# Patient Record
Sex: Male | Born: 1965 | Race: Black or African American | Hispanic: No | Smoking: Current some day smoker
Health system: Southern US, Community
[De-identification: ages and names within clinical notes are randomized; demographics above are authoritative.]

## PROBLEM LIST (undated history)

## (undated) DIAGNOSIS — J302 Other seasonal allergic rhinitis: Secondary | ICD-10-CM

## (undated) DIAGNOSIS — E785 Hyperlipidemia, unspecified: Secondary | ICD-10-CM

## (undated) DIAGNOSIS — E1165 Type 2 diabetes mellitus with hyperglycemia: Principal | ICD-10-CM

## (undated) HISTORY — DX: Hyperlipidemia, unspecified: E78.5

## (undated) HISTORY — DX: Type 2 diabetes mellitus with hyperglycemia: E11.65

## (undated) HISTORY — DX: Other seasonal allergic rhinitis: J30.2

## (undated) HISTORY — PX: OTHER SURGICAL HISTORY: SHX169

---

## 2005-07-05 HISTORY — PX: NASAL SINUS SURGERY: SHX719

## 2015-10-27 ENCOUNTER — Ambulatory Visit (INDEPENDENT_AMBULATORY_CARE_PROVIDER_SITE_OTHER): Payer: BLUE CROSS/BLUE SHIELD | Admitting: Endocrinology

## 2015-10-27 ENCOUNTER — Encounter: Payer: Self-pay | Admitting: Endocrinology

## 2015-10-27 VITALS — BP 130/86 | HR 100 | Temp 98.7°F | Resp 16 | Ht 68.0 in | Wt 212.4 lb

## 2015-10-27 DIAGNOSIS — E291 Testicular hypofunction: Secondary | ICD-10-CM

## 2015-10-27 DIAGNOSIS — R5383 Other fatigue: Secondary | ICD-10-CM

## 2015-10-27 DIAGNOSIS — N521 Erectile dysfunction due to diseases classified elsewhere: Secondary | ICD-10-CM | POA: Diagnosis not present

## 2015-10-27 DIAGNOSIS — R7989 Other specified abnormal findings of blood chemistry: Secondary | ICD-10-CM

## 2015-10-27 NOTE — Patient Instructions (Signed)
Will call when labs back  Consider seeing urologist

## 2015-10-27 NOTE — Progress Notes (Signed)
Patient ID: Adam Walker, male   DOB: 03/13/1965, 50 y.o.   MRN: [id number]          Chief complaint: Difficulty with erections  Referring physician: None  History of Present Illness  Hypogonadismwas diagnosed in 10/2015  He has had complaints offatigue for a year or 2 which he blames on weight gain and poor diet.   However his main concern is difficulty with erections for about 3 weeks only, as detailed in the review of systems He does not complain of decreased motivation, decreased libido    There is no history of the following: Hot flushes, night sweats, breast enlargement, long term anabolic steroid use, history of testicular injury or mumps in childhood. He did use some oral anabolic steroids 7-8 years ago for short time when he was weightlifting but none subsequently  No history of osteopenia or low impact fracture  Prior lab results showtestosterone levels of:   Fasting morning testosterone on 10/20/15 = 277 and free testosterone 7.0, normal 6.6-21.5  No results found for: TESTOSTERONE  Prolactin level: None available  No results found for: [facility]         Medication List    Notice  As of 10/27/2015  4:05 PM   You have not been prescribed any medications.      Allergies:  Allergies  Allergen Reactions  . Bactrim [Sulfamethoxazole-Trimethoprim] Itching    No past medical history on file.  No past surgical history on file.  Family History  Problem Relation Age of Onset  . Diabetes Mother   . Stroke Maternal Grandmother   . Stroke Paternal Grandmother     Social History:  reports that he has been smoking Cigars.  He has never used smokeless tobacco. His alcohol and drug histories are not on file.  Review of Systems  Constitutional: Positive for weight gain.       Gradually gained 30 lbs since 6 years ago, he thinks this is from poor diet and let of exercise but is trying to improve diet now  HENT: Positive for headaches.   Eyes: Negative for  blurred vision.  Respiratory: Negative for shortness of breath.   Cardiovascular: Negative for palpitations.  Endocrine: Positive for fatigue and erectile dysfunction.       Has had fatigue for about 1-2 years, recently slightly better with improving diet. He has intermittently abnormal erections for 3 weeks, sometimes very little but generally less full.  Has decreased but present nocturnal erections recently  Genitourinary: Negative for slow stream.  Musculoskeletal: Negative for joint pain.  Skin: Negative for rash.  Neurological: Negative for numbness.  Psychiatric/Behavioral: Negative for depressed mood.      General Examination:   BP 130/86 mmHg  Pulse 100  Temp(Src) 98.7 F (37.1 C)  Resp 16  Ht 5\' 8"  (1.727 m)  Wt 212 lb 6.4 oz (96.344 kg)  BMI 32.30 kg/m2  SpO2 96%  GENERAL APPEARANCE mild generalized obesity, mostly abdominal.  SKIN:normal, no rash or pigmentation.  HEENT:Oral mucosa normal. Relatively low and small oropharyngeal opening.  EYES:normal external appearance of eyes, Fundii benign.   NECK:no lymphadenopathy, no thyromegaly.  CHEST: Gynecomastia present, minimal on the left only LUNGS:clear to auscultation bilaterally, no wheezes, rhonchi, rales.   HEART:normal S1 And S2, no S3, S4, murmur or click.  ABDOMEN:Soft, no hepatosplenomegaly, no mass palpated    MALE GENITOURINARY:Testicular about 4 cm, no mass felt, did not feel cystic  MUSCULOSKELETALNo enlargement or deformity of joints.  EXTREMITIES:no clubbing, no edema.  NEUROLOGIC EXAM: Biceps reflexes normal (2+) bilaterally.   Assessment/ Plan:  1.  Probable mild hypogonadism  He has nonspecific symptoms of fatigue and not clear if he is truly symptomatic from hypogonadism since he has no change in libido or symptoms of decreased motivation Also his free testosterone is low normal Objectively does not have any  gynecomastia or testicular atrophy  His main symptom is erectile dysfunction of recent onset of unclear etiology; he does not have known diagnosed diabetes and only other potential factors are possibly hypertension and history of smoking  He needs further evaluation for his hypogonadism and also repeat free testosterone level in the morning Will order LH, prolactin, fasting glucose and free T4 levels also  Discussed various options for testosterone supplementation including transdermal gel or liquid preparations, patch and clomiphene.  Discussed pros and cons of various treatments  2.  Erectile dysfunction: Discussed that he may need to have evaluation from urologist, also he is complaining of some nonspecific testicular pain and mild swelling.  Does not appear to have any hydrocele on exam  3.  History of mild hypercholesterolemia.  Unknown glucose level  Brieana Shimmin 10/27/2015, 4:05 PM

## 2015-11-05 ENCOUNTER — Other Ambulatory Visit: Payer: Self-pay | Admitting: Endocrinology

## 2015-11-07 LAB — T4, FREE: Free T4: 1.22 ng/dL (ref 0.82–1.77)

## 2015-11-07 LAB — TESTOSTERONE, FREE, TOTAL, SHBG
SEX HORMONE BINDING: 23 nmol/L (ref 16.5–55.9)
Testosterone, Free: 4.6 pg/mL — ABNORMAL LOW (ref 6.8–21.5)
Testosterone: 229 ng/dL — ABNORMAL LOW (ref 348–1197)

## 2015-11-07 LAB — GLUCOSE, RANDOM: GLUCOSE: 166 mg/dL — AB (ref 65–99)

## 2015-11-07 LAB — PROLACTIN: PROLACTIN: 6.4 ng/mL (ref 4.0–15.2)

## 2015-11-07 LAB — LUTEINIZING HORMONE: LH: 6.7 m[IU]/mL (ref 1.7–8.6)

## 2015-11-12 ENCOUNTER — Ambulatory Visit: Payer: Self-pay | Admitting: Endocrinology

## 2015-11-18 NOTE — Progress Notes (Signed)
Quick Note:  Please let patient know that the testosterone is definitely low, likely to be from sluggish pituitary gland. Recommend clomiphene 50 mg, half tablet 3 times a week that should help improve natural production of testosterone, needs prescription Follow-up to be in 6 weeks from now, may need to change the scheduled appointment Also need to know if he was fasting as blood sugar was 166, high ______

## 2015-11-19 ENCOUNTER — Telehealth: Payer: Self-pay | Admitting: Endocrinology

## 2015-11-19 ENCOUNTER — Other Ambulatory Visit: Payer: Self-pay | Admitting: *Deleted

## 2015-11-19 ENCOUNTER — Encounter: Payer: Self-pay | Admitting: *Deleted

## 2015-11-19 MED ORDER — CLOMIPHENE CITRATE 50 MG PO TABS: ORAL_TABLET | ORAL | Status: DC

## 2015-11-19 NOTE — Telephone Encounter (Signed)
Relayed results pt was fasting when he had his blood work done  Pt is aware of the rx and how to take it

## 2015-11-19 NOTE — Telephone Encounter (Signed)
Noted  

## 2015-12-08 ENCOUNTER — Ambulatory Visit (INDEPENDENT_AMBULATORY_CARE_PROVIDER_SITE_OTHER): Payer: BLUE CROSS/BLUE SHIELD | Admitting: Endocrinology

## 2015-12-08 ENCOUNTER — Encounter: Payer: Self-pay | Admitting: Endocrinology

## 2015-12-08 VITALS — BP 132/74 | HR 83 | Temp 98.0°F | Resp 14 | Ht 68.0 in | Wt 207.6 lb

## 2015-12-08 DIAGNOSIS — E291 Testicular hypofunction: Secondary | ICD-10-CM | POA: Diagnosis not present

## 2015-12-08 DIAGNOSIS — E230 Hypopituitarism: Secondary | ICD-10-CM | POA: Diagnosis not present

## 2015-12-08 DIAGNOSIS — R7301 Impaired fasting glucose: Secondary | ICD-10-CM

## 2015-12-08 DIAGNOSIS — R7989 Other specified abnormal findings of blood chemistry: Secondary | ICD-10-CM

## 2015-12-08 LAB — TESTOSTERONE: TESTOSTERONE: 229.87 ng/dL — AB (ref 300.00–890.00)

## 2015-12-08 LAB — HEMOGLOBIN A1C: HEMOGLOBIN A1C: 8.7 % — AB (ref 4.6–6.5)

## 2015-12-08 NOTE — Progress Notes (Signed)
Patient ID: Adam Walker, male   DOB: Jul 17, 1965, 50 y.o.   MRN: [id number]          Chief complaint: Follow-up of low testosterone   History of Present Illness  Hypogonadismwas diagnosed in 10/2015 He has had complaints offatigue for a year or 2 which he blames on weight gain and poor diet.   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 Latest known visit with results is:  Orders Only on 11/05/2015  Component Date Value Ref Range Status  . Testosterone 11/05/2015 229* 348 - 1197 ng/dL Final  . Comment, Testosterone 11/05/2015 Comment   Final   Comment: Adult male reference interval is based on a population of lean males up to 49 years old.   . Testosterone, Free 11/05/2015 4.6* 6.8 - 21.5 pg/mL Final  . Sex Hormone Binding 11/05/2015 23.0  16.5 - 55.9 nmol/L Final  . Free T4 11/05/2015 1.22  0.82 - 1.77 ng/dL Final  . LH 11/05/2015 6.7  1.7 - 8.6 mIU/mL Final  . Prolactin 11/05/2015 6.4  4.0 - 15.2 ng/mL Final  . Glucose 11/05/2015 166* 65 - 99 mg/dL Final     OTHER active problems: See review of systems      Medication List       This list is accurate as of: 12/08/15  4:29 PM.  Always use  your most recent med list.               clomiPHENE 50 MG tablet  Commonly known as:  CLOMID  Take 1/2 tablet 3 times a week        Allergies:  Allergies  Allergen Reactions  . Bactrim [Sulfamethoxazole-Trimethoprim] Itching    Past Medical History  Diagnosis Date  . Hyperlipidemia     Cholesterol 239    No past surgical history on file.  Family History  Problem Relation Age of Onset  . Diabetes Mother   . Stroke Maternal Grandmother   . Stroke Paternal Grandmother     Social History:  reports that he has been smoking Cigars.  He has never used smokeless tobacco. His alcohol and drug histories are not on file.  Review of Systems   He has been trying to lose weight with low fat diet, eating more salads and avoiding restaurant meals Only recently starting to do some exercise  Wt Readings from Last 3 Encounters:  12/08/15 207 lb 9.6 oz (94.167 kg)  10/27/15 212 lb 6.4 oz (96.344 kg)   His fasting glucose in the lab was 166 in April He has improved his diet and lost weight.  Has not been told to have diabetes previously  No results found for: HGBA1C No results  found for: GLUF, MICROALBUR, LDLCALC, CREATININE  General Examination:   BP 132/74 mmHg  Pulse 83  Temp(Src) 98 F (36.7 C)  Resp 14  Ht 5\' 8"  (1.727 m)  Wt 207 lb 9.6 oz (94.167 kg)  BMI 31.57 kg/m2  SpO2 97%     Assessment/ Plan:  Hypogonadotropic hypogonadism  He has some improvement of his fatigue and erectile dysfunction with starting, phenol but this was only about 3 weeks ago Will check his testosterone level again to see if he has any improvement Also do better with his current change in lifestyle which is helping him lose weight  2.  Erectile dysfunction: Mildly improved, not clear if this is from possible diabetes and also may have some effect of the hypogonadism  3.  History of hypoglycemia: Will check A1c and glucose again today  Lamar Naef 12/08/2015, 4:29 PM

## 2015-12-09 ENCOUNTER — Other Ambulatory Visit (INDEPENDENT_AMBULATORY_CARE_PROVIDER_SITE_OTHER): Payer: BLUE CROSS/BLUE SHIELD

## 2015-12-09 DIAGNOSIS — E230 Hypopituitarism: Secondary | ICD-10-CM | POA: Diagnosis not present

## 2015-12-09 LAB — GLUCOSE, RANDOM: Glucose: 179 mg/dL — ABNORMAL HIGH (ref 65–99)

## 2015-12-09 LAB — LUTEINIZING HORMONE: LH: 11.16 m[IU]/mL — ABNORMAL HIGH (ref 1.50–9.30)

## 2015-12-09 NOTE — Progress Notes (Signed)
Quick Note:  Please let patient know that his blood sugar is in the diabetic range and still not improved. Recommend metformin ER 500 mg at dinner for the first days and then 2 tablets daily Will also recommend him setting up appointment with Vaughan Basta for diabetes education and home glucose monitoring His testosterone level is still slightly low but need to give it more time with the medication, next appointment should be done in the morning  ______

## 2015-12-23 ENCOUNTER — Telehealth: Payer: Self-pay | Admitting: Endocrinology

## 2015-12-23 MED ORDER — METFORMIN HCL ER 500 MG PO TB24: ORAL_TABLET | ORAL | Status: DC

## 2015-12-23 NOTE — Telephone Encounter (Signed)
Rx submitted per pt's request.  

## 2015-12-23 NOTE — Telephone Encounter (Signed)
Patient need a prescription for metformin send to  [id number] - [address], [place] [phone] (Phone) [phone] (Fax)

## 2016-01-19 ENCOUNTER — Ambulatory Visit: Payer: BLUE CROSS/BLUE SHIELD | Admitting: Endocrinology

## 2016-01-20 ENCOUNTER — Telehealth: Payer: Self-pay | Admitting: Endocrinology

## 2016-01-20 NOTE — Telephone Encounter (Signed)
meformin is causing constipation please advise

## 2016-01-20 NOTE — Telephone Encounter (Signed)
[phone] call this # ext [phone]

## 2016-01-21 NOTE — Telephone Encounter (Signed)
See note below and please advise, Thanks! 

## 2016-01-21 NOTE — Telephone Encounter (Signed)
Change in metformin ER to metformin regular, 500 mg twice a day

## 2016-01-22 MED ORDER — METFORMIN HCL 500 MG PO TABS: 500.0000 mg | ORAL_TABLET | Freq: Two times a day (BID) | ORAL | Status: DC

## 2016-01-22 NOTE — Telephone Encounter (Signed)
Patient notified and Rx sent in. 

## 2016-01-22 NOTE — Addendum Note (Signed)
Addended by: Royann Shivers A on: 01/22/2016 09:23 AM   Modules accepted: Orders, Medications

## 2016-02-04 ENCOUNTER — Encounter: Payer: Self-pay | Admitting: Endocrinology

## 2016-02-04 ENCOUNTER — Ambulatory Visit (INDEPENDENT_AMBULATORY_CARE_PROVIDER_SITE_OTHER): Payer: BLUE CROSS/BLUE SHIELD | Admitting: Endocrinology

## 2016-02-04 VITALS — BP 148/82 | HR 103 | Ht 68.0 in | Wt 205.0 lb

## 2016-02-04 DIAGNOSIS — E230 Hypopituitarism: Secondary | ICD-10-CM | POA: Diagnosis not present

## 2016-02-04 DIAGNOSIS — E1165 Type 2 diabetes mellitus with hyperglycemia: Secondary | ICD-10-CM | POA: Diagnosis not present

## 2016-02-04 LAB — BASIC METABOLIC PANEL
BUN: 11 mg/dL (ref 6–23)
CALCIUM: 9.4 mg/dL (ref 8.4–10.5)
CO2: 29 meq/L (ref 19–32)
CREATININE: 0.87 mg/dL (ref 0.40–1.50)
Chloride: 101 mEq/L (ref 96–112)
GFR: 120 mL/min (ref >60.00)
GLUCOSE: 129 mg/dL — AB (ref 70–99)
Potassium: 3.7 mEq/L (ref 3.5–5.1)
SODIUM: 138 meq/L (ref 135–145)

## 2016-02-04 MED ORDER — CLOMIPHENE CITRATE 50 MG PO TABS: ORAL_TABLET | ORAL | 1 refills | Status: DC

## 2016-02-04 MED ORDER — GLUCOSE BLOOD VI STRP: ORAL_STRIP | 2 refills | Status: DC

## 2016-02-04 NOTE — Patient Instructions (Addendum)
Check blood sugars on waking up  2 days a week  Also check blood sugars about 2 hours after a meal and do this after different meals by rotation  Recommended blood sugar levels on waking up is 90-130 and about 2 hours after meal is 130-160  Please bring your blood sugar monitor to each visit, thank you  Exercise

## 2016-02-04 NOTE — Progress Notes (Addendum)
Patient ID: Adam Walker, male   DOB: 02/15/1966, 50 y.o.   MRN: [id number]          Chief complaint: Follow-up of low testosterone   History of Present Illness  Hypogonadismwas diagnosed in 10/2015 He has had complaints offatigue for a year or 2 which he blames on weight gain and poor diet.   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  Latest known visit with results is:  Appointment on 12/09/2015  Component Date Value Ref Range Status  . LH 12/09/2015 11.16* 1.50 - 9.30 mIU/mL Final   Comment: Male Reference Range:20-70 yrs     1.5-9.3 mIU/mL>70 yrs       3.1-35.6 mIU/mLFemale Reference Range:Follicular Phase     1.9-12.5 mIU/mLMidcycle             8.7-76.3 mIU/mLLuteal Phase         0.5-16.9 mIU/mL  Post Menopausal      15.9-54.0  mIU/mLPregnant             <1.5 mIU/mLContraceptives       0.7-5.6  mIU/mL      OTHER active problems: See review of systems      Medication List       Accurate as of 02/04/16  1:30 PM. Always use your most recent med list.          clomiPHENE 50 MG tablet Commonly known as:  CLOMID Take 1/2 tablet 3 times a week   metFORMIN 500 MG tablet Commonly known as:  GLUCOPHAGE Take 1 tablet (500 mg total) by mouth 2 (two) times daily with a meal.       Allergies:  Allergies  Allergen Reactions  . Bactrim [Sulfamethoxazole-Trimethoprim] Itching    Past Medical History:  Diagnosis Date  . Hyperlipidemia    Cholesterol 239    No past surgical history on file.  Family History  Problem Relation Age of Onset  . Diabetes Mother   . Stroke Maternal Grandmother   . Stroke Paternal Grandmother     Social History:  reports that he has been smoking Cigars.  He has never used smokeless tobacco. His alcohol and drug histories are not on file.  Review of Systems   He has been trying to lose weight with low fat diet, eating more salads and avoiding restaurant meals Only some exercise  Wt  Readings from Last 3 Encounters:  02/04/16 205 lb (93 kg)  12/08/15 207 lb 9.6 oz (94.2 kg)  10/27/15 212 lb 6.4 oz (96.3 kg)    His fasting glucose in the lab was 178 A1c was checked and was 8.7 He was started on metformin and he is taking 1000 mg daily He was supposed to see better nurse educator to start monitoring and basic education but has not made an appointment  He says that occasionally he has a blood sugar checked at his family members house and he gets a reading of about 170 about 1-2 hours after eating He has not modified his diet and he further and does not have a meal plan. Also has been irregular with exercise because of his work hours  Lab Results  Component Value Date   HGBA1C 8.7 (H) 12/08/2015   No results found for: GLUF, MICROALBUR, [place], CREATININE  General Examination:   BP (!) 148/82 (BP Location: Left Arm, Patient  Position: Sitting, Cuff Size: Normal)   Pulse (!) 103   Ht 5\' 8"  (1.727 m)   Wt 205 lb (93 kg)   SpO2 97%   BMI 31.17 kg/m      Assessment/ Plan:  Hypogonadotropic hypogonadism  He has  improvement of his fatigue and erectile dysfunction with starting Clomiphene but he did not refill the prescription as directed Discussed that he needs to take this long-term and will need to refill this Will check his testosterone level again in about 6 weeks to see if he has any improvement  2.  Erectile dysfunction: Mildly improved, not clear if this is from diabetes, he reports less difficulties with using clomiphene anyway  3.  Diabetes with A1c 8.7 on metformin 1 g.  He needs to get diabetes education and start glucose monitoring and this was done today Follow-up in about 6-7 weeks Emphasized lifestyle changes  Nelson Noone 02/04/2016, 1:30 PM

## 2016-02-05 LAB — FRUCTOSAMINE: FRUCTOSAMINE: 252 umol/L (ref 0–285)

## 2016-02-26 ENCOUNTER — Telehealth: Payer: Self-pay | Admitting: Endocrinology

## 2016-02-26 MED ORDER — GLUCOSE BLOOD VI STRP: ORAL_STRIP | 2 refills | Status: AC

## 2016-02-26 MED ORDER — METFORMIN HCL 500 MG PO TABS: 500.0000 mg | ORAL_TABLET | Freq: Two times a day (BID) | ORAL | 3 refills | Status: DC

## 2016-02-26 NOTE — Telephone Encounter (Signed)
verio test strips [facility] [place]

## 2016-02-26 NOTE — Telephone Encounter (Signed)
PT called and said that the pharmacy told him that his test strips would not be covered under insurance.  He wants to know what he needs to do to get his test strips.  Please advise. He said you may call him at work if he does not answer his cell. # [phone] ext [phone]

## 2016-02-26 NOTE — Telephone Encounter (Signed)
Rx for Metformin and one touch test strips [facility] [place]

## 2016-02-27 NOTE — Telephone Encounter (Signed)
Attempted to reach the pt. Pt was unavailable. Will try again at a later time.  

## 2016-03-05 NOTE — Telephone Encounter (Signed)
Requested a call back from the patient to discuss.  

## 2016-04-01 ENCOUNTER — Telehealth: Payer: Self-pay | Admitting: Endocrinology

## 2016-04-01 ENCOUNTER — Ambulatory Visit: Payer: BLUE CROSS/BLUE SHIELD | Admitting: Endocrinology

## 2016-04-01 DIAGNOSIS — Z0289 Encounter for other administrative examinations: Secondary | ICD-10-CM

## 2016-04-01 NOTE — Telephone Encounter (Signed)
right

## 2016-04-01 NOTE — Telephone Encounter (Signed)
Patient no showed today's appt. Please advise on how to follow up. °A. No follow up necessary. °B. Follow up urgent. Contact patient immediately. °C. Follow up necessary. Contact patient and schedule visit in ___ days. °D. Follow up advised. Contact patient and schedule visit in ____weeks. ° °

## 2016-04-01 NOTE — Telephone Encounter (Signed)
Per check out note from last office visit pt could have labs same day. Last labs on 02/04/16

## 2016-04-01 NOTE — Telephone Encounter (Signed)
Reschedule at the earliest possible time, make sure he has had his labs also recently

## 2016-04-02 NOTE — Telephone Encounter (Signed)
Called pt no answer and no vm Will call again next week

## 2016-04-08 ENCOUNTER — Telehealth: Payer: Self-pay | Admitting: Endocrinology

## 2016-04-08 ENCOUNTER — Other Ambulatory Visit: Payer: Self-pay | Admitting: *Deleted

## 2016-04-08 MED ORDER — CLOMIPHENE CITRATE 50 MG PO TABS: ORAL_TABLET | ORAL | 3 refills | Status: DC

## 2016-04-08 MED ORDER — METFORMIN HCL 500 MG PO TABS: 500.0000 mg | ORAL_TABLET | Freq: Two times a day (BID) | ORAL | 3 refills | Status: DC

## 2016-04-08 NOTE — Telephone Encounter (Signed)
Rxs sent

## 2016-04-08 NOTE — Telephone Encounter (Signed)
[facility] [place]

## 2016-04-09 ENCOUNTER — Ambulatory Visit (INDEPENDENT_AMBULATORY_CARE_PROVIDER_SITE_OTHER): Payer: BLUE CROSS/BLUE SHIELD | Admitting: Endocrinology

## 2016-04-09 ENCOUNTER — Encounter: Payer: Self-pay | Admitting: Endocrinology

## 2016-04-09 VITALS — BP 154/96 | HR 102 | Temp 98.3°F | Resp 16 | Ht 68.0 in | Wt 207.0 lb

## 2016-04-09 DIAGNOSIS — E230 Hypopituitarism: Secondary | ICD-10-CM | POA: Diagnosis not present

## 2016-04-09 DIAGNOSIS — E782 Mixed hyperlipidemia: Secondary | ICD-10-CM | POA: Diagnosis not present

## 2016-04-09 DIAGNOSIS — E119 Type 2 diabetes mellitus without complications: Secondary | ICD-10-CM | POA: Diagnosis not present

## 2016-04-09 LAB — POCT GLYCOSYLATED HEMOGLOBIN (HGB A1C): Hemoglobin A1C: 6.4

## 2016-04-09 MED ORDER — SITAGLIPTIN PHOS-METFORMIN HCL 50-500 MG PO TABS: 1.0000 | ORAL_TABLET | Freq: Two times a day (BID) | ORAL | 2 refills | Status: DC

## 2016-04-09 NOTE — Patient Instructions (Signed)
Get labs in 1 month  Check blood sugars on waking up  2-3/7 days  Also check blood sugars about 2 hours after a meal and do this after different meals by rotation  Recommended blood sugar levels on waking up is 90-130 and about 2 hours after meal is 130-160  Please bring your blood sugar monitor to each visit, thank you

## 2016-04-09 NOTE — Progress Notes (Signed)
Patient ID: Adam Walker, male   DOB: 1966/07/03, 50 y.o.   MRN: [id number]          Chief complaint: Follow-up of low testosterone   History of Present Illness  Hypogonadismwas diagnosed in 10/2015  He has had complaints offatigue for a year or 2 which he blames on weight gain and poor diet.   However his main complaint on initial exam was  difficulty with erections for about 3 weeks . 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 he was started on clomiphene 25 mg 3 times a week, this was prescribed on 11/19/15  He thinks with this his energy level is better and has less complaints of sexual dysfunction However he did not refill his clomiphene prior to his last visit in also now has been out of it for 1-2 weeks again  He was supposed to get his labs drawn in the morning locally but apparently could not have this done because the lab did not have an order for this   Lab Results  Component Value Date   TESTOSTERONE 229.87 (L) 12/08/2015   TESTOSTERONE 229 (L) 11/05/2015       Office Visit on 04/09/2016  Component Date Value Ref Range Status  . Hemoglobin A1C 04/09/2016 6.4   Final     OTHER active problemsIncluding diabetes: See review of systems      Medication List       Accurate as of 04/09/16 11:59 PM. Always use your most recent med list.          clomiPHENE 50 MG tablet Commonly known as:  CLOMID Take 1/2 tablet 3 times a week   glucose blood test strip Commonly known as:  ONETOUCH VERIO Use to check blood sugar 1 time per day.   metFORMIN 500 MG tablet Commonly known as:   GLUCOPHAGE Take 1 tablet (500 mg total) by mouth 2 (two) times daily with a meal.   sitaGLIPtin-metformin 50-500 MG tablet Commonly known as:  JANUMET Take 1 tablet by mouth 2 (two) times daily with a meal.       Allergies:  Allergies  Allergen Reactions  . Bactrim [Sulfamethoxazole-Trimethoprim] Itching    Past Medical History:  Diagnosis Date  . Hyperlipidemia    Cholesterol 239    No past surgical history on file.  Family History  Problem Relation Age of Onset  . Diabetes Mother   . Stroke Maternal Grandmother   . Stroke Paternal Grandmother     Social History:  reports that he has been smoking Cigars.  He has never used smokeless tobacco. His alcohol and drug histories are not on file.  Review of Systems   He has been trying to lose weight with low fat diet, eating more salads But is not consistent recently Only doing some exercise  Wt Readings from Last 3 Encounters:  04/09/16 207 lb (93.9 kg)  02/04/16 205 lb (93 kg)  12/08/15 207 lb 9.6 oz (94.2 kg)    DIABETES:  His fasting glucose in the lab was 178 initially A1c was checked and was 8.7 He was started on metformin and he is taking  1000 mg daily He was supposed to see better nurse educator to start monitoring and basic education but has not made an appointment Fructosamine in August was 252  Because of his complaints of constipation with extended release metformin he is not taking regular metformin  Recently he thinks mostly compliant with this He was started on home glucose monitoring and he says his blood sugar ranges from 97-210, higher after meals based on his diet  Also has been irregular with exercise because of his work hours  He has not established with a local physician for general care  Lab Results  Component Value Date   HGBA1C 6.4 04/09/2016   HGBA1C 8.7 (H) 12/08/2015   Lab Results  Component Value Date   CREATININE 0.87 02/04/2016    ?  Hypertension: He blames his blood  pressure being high today on stress   General Examination:   BP (!) 154/96   Pulse (!) 102   Temp 98.3 F (36.8 C)   Resp 16   Ht 5\' 8"  (1.727 m)   Wt 207 lb (93.9 kg)   SpO2 98%   BMI 31.47 kg/m      Assessment/ Plan:  Hypogonadotropic hypogonadism  He has  improvement of his fatigue and erectile dysfunction with starting Clomiphene but he recently ran out of his medication again and has not taken it for 1-2 weeks He was supposed to have fasting testosterone level checked to monitor his treatment but has not done so  Since she is subjectively doing better will plan to continue clomiphene for now He will get his fasting testosterone level and luteinizing hormone done in about a month Will adjust medication accordingly  2.  History of hyperlipidemia: Needs fasting labs done through his PCP  3.  Diabetes with baseline A1c 8.7 With trying to improve his diet and some weight loss his blood sugars are better Currently only on 500 twice a day of metformin Even though he has a better A1c of 6.4 he still has some high readings at home  He may benefit long-term from adding Januvia and discussed how this will help his control He will switch metformin to Janumet 50/500 twice a day Encouraged him to start exercise regularly He will also find out what meter is covered by his insurance and call back Discussed timing and frequency of glucose monitoring and blood sugar targets  Patient Instructions  Get labs in 1 month  Check blood sugars on waking up  2-3/7 days  Also check blood sugars about 2 hours after a meal and do this after different meals by rotation  Recommended blood sugar levels on waking up is 90-130 and about 2 hours after meal is 130-160  Please bring your blood sugar monitor to each visit, thank you     Counseling time on subjects discussed above is over 50% of today's 25 minute visit   Adam Walker 04/11/2016, 8:31 PM

## 2016-05-06 LAB — TSH: TSH: 1.52 u[IU]/mL (ref <=5.90)

## 2016-05-06 LAB — HEMOGLOBIN A1C: HEMOGLOBIN A1C: 7.2

## 2016-06-03 ENCOUNTER — Encounter: Payer: Self-pay | Admitting: Endocrinology

## 2016-07-30 ENCOUNTER — Other Ambulatory Visit: Payer: Self-pay | Admitting: Endocrinology

## 2016-08-23 ENCOUNTER — Other Ambulatory Visit: Payer: Self-pay | Admitting: Endocrinology

## 2016-09-16 ENCOUNTER — Other Ambulatory Visit: Payer: Self-pay

## 2016-10-25 ENCOUNTER — Telehealth: Payer: Self-pay | Admitting: Endocrinology

## 2016-10-26 NOTE — Telephone Encounter (Signed)
Last ov 04/09/16 and no future scheduled ok to refill please advise

## 2016-10-26 NOTE — Telephone Encounter (Signed)
Patient needs appointment for any further refills.

## 2016-10-26 NOTE — Telephone Encounter (Signed)
Does not appear that he was scheduled for follow-up with labs, this needs to be done and he can have 30 day appointment

## 2016-11-03 ENCOUNTER — Encounter: Payer: Self-pay | Admitting: Endocrinology

## 2016-11-03 NOTE — Telephone Encounter (Signed)
Attempted to call the pt with no answer and no voicemail set up. Mailed a letter

## 2017-02-27 ENCOUNTER — Other Ambulatory Visit: Payer: Self-pay | Admitting: Endocrinology

## 2017-04-01 ENCOUNTER — Other Ambulatory Visit: Payer: Self-pay | Admitting: Endocrinology

## 2017-04-04 ENCOUNTER — Telehealth: Payer: Self-pay | Admitting: Endocrinology

## 2017-04-04 NOTE — Telephone Encounter (Signed)
routing

## 2017-04-04 NOTE — Telephone Encounter (Signed)
Patient last seen 04/09/2016. No follow up scheduled. Please advise if okay to refill?

## 2017-04-05 NOTE — Telephone Encounter (Signed)
Left message for patient to call to make an appt

## 2017-04-11 ENCOUNTER — Ambulatory Visit (INDEPENDENT_AMBULATORY_CARE_PROVIDER_SITE_OTHER): Payer: BLUE CROSS/BLUE SHIELD | Admitting: Endocrinology

## 2017-04-11 ENCOUNTER — Encounter: Payer: Self-pay | Admitting: Endocrinology

## 2017-04-11 VITALS — BP 154/108 | HR 103 | Ht 67.75 in | Wt 211.4 lb

## 2017-04-11 DIAGNOSIS — E1165 Type 2 diabetes mellitus with hyperglycemia: Secondary | ICD-10-CM | POA: Diagnosis not present

## 2017-04-11 DIAGNOSIS — E230 Hypopituitarism: Secondary | ICD-10-CM

## 2017-04-11 LAB — POCT GLYCOSYLATED HEMOGLOBIN (HGB A1C): HEMOGLOBIN A1C: 7.9

## 2017-04-11 MED ORDER — DULAGLUTIDE 0.75 MG/0.5ML ~~LOC~~ SOAJ: SUBCUTANEOUS | 1 refills | Status: DC

## 2017-04-11 NOTE — Patient Instructions (Signed)
Check blood sugars on waking up    Also check blood sugars about 2 hours after a meal and do this after different meals by rotation  Recommended blood sugar levels on waking up is 90-130 and about 2 hours after meal is 130-160  Please bring your blood sugar monitor to each visit, thank you  Start TRULICITYwith the pen as shown once weekly on the same day of the week.  You may inject in the stomach, thigh or arm as indicated in the brochure given. .  You will feel fullness of the stomach with starting the medication and should try to keep the portions at meals small.  You may experience nausea in the first few days which usually gets better over time   If any questions or concerns are present call the office or the  [facility] at [phone]. Also visit [email or web address] website for more useful information

## 2017-04-11 NOTE — Progress Notes (Signed)
Patient ID: Adam Walker, male   DOB: 08-08-1965, 51 y.o.   MRN: [id number]          Chief complaint: Follow-up of diabetes and low testosterone   History of Present Illness  Hypogonadismwas diagnosed in 10/2015  He has had complaints offatigue for a year or 2 which he blames on weight gain and poor diet.   However his main complaint on initial exam was  difficulty with erections for about 3 weeks . 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 he was started on clomiphene 25 mg 3 times a week, this was prescribed on 11/19/15 He had less fatigue and sexual dysfunction complaints with starting the clomiphene His follow-up testosterone done through his PCP was 387  However he did not follow-up since his last visit a year ago and has not taken clomiphene for several months Again his having some fatigue and decreased libido  Lab Results  Component Value Date   TESTOSTERONE 229.87 (L) 12/08/2015   TESTOSTERONE 229 (L) 11/05/2015       OTHER active problemsIncluding diabetes: See review of systems    Allergies as of 04/11/2017      Reactions   Bactrim [sulfamethoxazole-trimethoprim] Itching      Medication List       Accurate as of 04/11/17 11:59 PM. Always use your most recent med list.          clomiPHENE 50 MG tablet Commonly known as:  CLOMID TAKE 1/2 TABLET 3 TIMES A WEEK   Dulaglutide 0.75 MG/0.5ML Sopn Commonly known as:  TRULICITY Inject in the abdominal skin as directed once a week   glucose blood test strip Commonly known as:  ONETOUCH VERIO Use to check blood sugar 1 time per  day.       Past Medical History:  Diagnosis Date  . Hyperlipidemia    Cholesterol 239   Family History  Problem Relation Age of Onset  . Diabetes Mother   . Stroke Maternal Grandmother   . Stroke Paternal Grandmother      Social History:  reports that he has been smoking Cigars.  He has never used smokeless tobacco. His alcohol and drug histories are not on file.  Review of Systems    DIABETES:  A1c At baseline was 8.7 with fasting glucose 178 He was started on metformin but because of constipation with the metformin ER he was switched to Janumet prior to his last visit However his A1c appears to be higher at 7.9 now compared to 7.2   Recently he has been taking his Janumet although just running out of it recently He was started on home glucose monitoring  Did not bring his monitor but his blood sugar range is from 120-220 He thinks his blood sugars are higher when he goes off his diet He says he cannot control portions or what he choses when he is eating out although his doing somewhat better with avoiding donuts and sweets  Also has been irregular with exercise because of his work hours and lack of motivation  Wt Readings from Last 3 Encounters:  04/11/17 211 lb 6.4 oz (95.9 kg)  04/09/16 207 lb (93.9 kg)  02/04/16 205 lb (93 kg)   Lab Results  Component Value Date   HGBA1C 7.9 04/11/2017   HGBA1C 7.2 05/06/2016   HGBA1C 6.4 04/09/2016   Lab Results  Component Value Date   CREATININE 0.87 02/04/2016     General Examination:   BP (!) 154/108   Pulse (!) 103   Ht 5' 7.75" (1.721 m)   Wt 211 lb 6.4 oz (95.9 kg)   SpO2 97%   BMI 32.38 kg/m       Assessment/ Plan:  Hypogonadotropic hypogonadism  He has Not followed up as directed for a year Also has not been on his clomiphene for about a year He seems to be symptomatic again with fatigue and decreased libido Since previously he had the medical improvement and normalization of his  testosterone levels he needs to start taking clomiphene again He wants to take this as a whole tablet instead of half and he can take 50 mg twice a day He will need to get fasting labs done from [facility]. a week before he is seen in follow-up in 6 weeks Emphasized the need for regular follow-up   2.  History of hyperlipidemia: Needs follow-up with PCP  3.  Diabetes with baseline A1c 8.7 He has somewhat higher blood sugar compared to his last visit and increased A1c Although he tolerates Janumet very well he is not able to watch his diet or be motivated to exercise  He is interested in a medication that will help control his portions and carbohydrate cravings along with improving his weight and blood sugar control He is a good candidate for drug like Victoza or Trulicity but for simplicity since he is new to injection will do well with TRULICITY  Discussed with the patient the nature of GLP-1 drugs, the action on various organ systems, how they benefit blood glucose control, as well as the benefit of weight loss and  increase satiety . Explained possible side effects, particularly nausea and vomiting that usually resolve over time; discussed safety information in package insert. Demonstrated the medication injection device and injection technique to the patient.  Showed patient where to inject the medication. To start with 0.75 mg dosage weekly for the first 4 weeks Patient brochure on Trulicity with enclosed co-pay card given  He does need to start checking his blood sugars Encouraged him to start walking for exercise He will stop Janumet for now, may need metformin in combination if needed  Counseling time on subjects discussed in assessment and plan sections is over 50% of today's 25 minute visit   Patient instructions: Also check blood sugars about 2 hours after a meal and do this after different meals by rotation  Recommended blood sugar levels on waking up is 90-130 and about 2  hours after meal is 130-160  Please bring your blood sugar monitor to each visit, thank you  Start TRULICITYwith the pen as shown once weekly on the same day of the week.  You may inject in the stomach, thigh or arm as indicated in the brochure given. .  You will feel fullness of the stomach with starting the medication and should try to keep the portions at meals small.  You may experience nausea in the first few days which usually gets better over time   If any questions or concerns are present call the office or the  [facility] at [phone]. Also visit [email or web address] website for more useful information  Ashiya Kinkead 04/11/2017, 9:44 PM

## 2017-04-12 MED ORDER — CLOMIPHENE CITRATE 50 MG PO TABS: ORAL_TABLET | ORAL | 1 refills | Status: DC

## 2017-04-28 ENCOUNTER — Telehealth: Payer: Self-pay | Admitting: Endocrinology

## 2017-04-28 NOTE — Telephone Encounter (Signed)
Patient is having surgery tomorrow at [facility] in [place]. [facility] needs the  patient's latest lab work to faxed to [phone].  If questions please call Mariann Laster at ph# [phone]

## 2017-04-28 NOTE — Telephone Encounter (Signed)
I have faxed this paperwork to the number provided.

## 2017-06-03 ENCOUNTER — Other Ambulatory Visit: Payer: Self-pay | Admitting: Endocrinology

## 2017-08-02 ENCOUNTER — Ambulatory Visit: Payer: BLUE CROSS/BLUE SHIELD | Admitting: Endocrinology

## 2017-08-02 ENCOUNTER — Encounter: Payer: Self-pay | Admitting: Endocrinology

## 2017-08-02 VITALS — BP 144/95 | HR 92 | Temp 98.5°F | Ht 67.75 in | Wt 213.8 lb

## 2017-08-02 DIAGNOSIS — E230 Hypopituitarism: Secondary | ICD-10-CM

## 2017-08-02 DIAGNOSIS — I10 Essential (primary) hypertension: Secondary | ICD-10-CM

## 2017-08-02 DIAGNOSIS — E1165 Type 2 diabetes mellitus with hyperglycemia: Secondary | ICD-10-CM

## 2017-08-02 HISTORY — DX: Type 2 diabetes mellitus with hyperglycemia: E11.65

## 2017-08-02 LAB — POCT GLYCOSYLATED HEMOGLOBIN (HGB A1C): Hemoglobin A1C: 7.6

## 2017-08-02 MED ORDER — LOSARTAN POTASSIUM 50 MG PO TABS: 50.0000 mg | ORAL_TABLET | Freq: Every day | ORAL | 3 refills | Status: DC

## 2017-08-02 MED ORDER — DULAGLUTIDE 1.5 MG/0.5ML ~~LOC~~ SOAJ: SUBCUTANEOUS | 1 refills | Status: DC

## 2017-08-02 MED ORDER — CLOMIPHENE CITRATE 50 MG PO TABS: ORAL_TABLET | ORAL | 2 refills | Status: DC

## 2017-08-02 NOTE — Patient Instructions (Signed)
Check blood sugars on waking up  2/7   Also check blood sugars about 2 hours after a meal and do this after different meals by rotation  Recommended blood sugar levels on waking up is 90-130 and about 2 hours after meal is 130-160  Please bring your blood sugar monitor to each visit, thank you  Exercise

## 2017-08-02 NOTE — Progress Notes (Signed)
Patient ID: Adam Walker, male   DOB: 15-Jan-1966, 52 y.o.   MRN: [id number]          Chief complaint: Follow-up of diabetes and low testosterone   History of Present Illness  Hypogonadismwas diagnosed in 10/2015  He has had complaints offatigue for a year or 2 which he blames on weight gain and poor diet.   However his main complaint on initial exam was  difficulty with erections for about 3 weeks . 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 he was started on clomiphene 25 mg 3 times a week, this was prescribed on 11/19/15 He had less fatigue and sexual dysfunction complaints with starting the clomiphene His follow-up testosterone done through his PCP was 387  However he did not take clomiphene and 2018 He started back in 10/18 but since he did not come back for follow-up he has not taken this again for at least a month or 2  However he says he is feeling overall better with his energy level especially with changing his diet No recent labs available  Lab Results  Component Value Date   TESTOSTERONE 229.87 (L) 12/08/2015   TESTOSTERONE 229 (L) 11/05/2015       OTHER active problems Including diabetes addressed today: See review of systems    Allergies as of 08/02/2017      Reactions   Bactrim [sulfamethoxazole-trimethoprim] Itching      Medication List        Accurate as of 08/02/17  1:39 PM. Always use your most recent med list.          clomiPHENE 50 MG tablet Commonly known as:  CLOMID 1 tablet twice a week   glucose blood test strip Commonly known as:  ONETOUCH VERIO Use to  check blood sugar 1 time per day.   losartan 50 MG tablet Commonly known as:  COZAAR Take 1 tablet (50 mg total) by mouth daily.   TRULICITY 7.82 NF/6.2ZH Sopn Generic drug:  Dulaglutide INJECT IN THE ABDOMINAL SKIN AS DIRECTED ONCE A WEEK       Past Medical History:  Diagnosis Date  . Hyperlipidemia    Cholesterol 239  . Uncontrolled type 2 diabetes mellitus with hyperglycemia, without long-term current use of insulin ([facility] [place]) 08/02/2017   Family History  Problem Relation Age of Onset  . Diabetes Mother   . Stroke Maternal Grandmother   . Stroke Paternal Grandmother      Social History:  reports that he has been smoking Cigars.  He has never used smokeless tobacco. His alcohol and drug histories are not on file.  Review of Systems    DIABETES:  A1c At baseline was 8.7 with fasting glucose 178 He was started on metformin but because of constipation with the metformin ER he was switched to Janumet  His A1c is only slightly better at 7.6 compared to 7.9 in October  Current management, problems identified and blood sugars:   He has not checked his blood sugars much and did not bring his monitor  He thinks his blood sugars are about 90 fasting and possibly 120 otherwise but usually not checking after  meals  Although he has been taking his Trulicity regularly instead of Janumet he has not lost any weight  Most recently is trying to cut back on meats and portions but still has not wanted lost any weight  Also trying to cut back on sweets and sugar drinks Also has been irregular with exercise and recently not motivated although he thinks he can join a gym now    [facility] Readings from Last 3 Encounters:  08/02/17 213 lb 12.8 oz (97 kg)  04/11/17 211 lb 6.4 oz (95.9 kg)  04/09/16 207 lb (93.9 kg)   Lab Results  Component Value Date   HGBA1C 7.6 08/02/2017   HGBA1C 7.9 04/11/2017   HGBA1C 7.2 05/06/2016   Lab Results  Component Value Date   CREATININE 0.87 02/04/2016     HYPERTENSION: His blood pressure has been consistently high and he has not discussed this with any primary care physician, currently going to see her new PCP   BP Readings from Last 3 Encounters:  08/02/17 (!) 144/95  04/11/17 (!) 154/108  04/09/16 (!) 154/96     General Examination:   BP (!) 144/95 (Cuff Size: Large)   Pulse 92   Temp 98.5 F (36.9 C) (Oral)   Ht 5' 7.75" (1.721 m)   Wt 213 lb 12.8 oz (97 kg)   SpO2 98%   BMI 32.75 kg/m   No ankle edema    Assessment/ Plan:  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  HYPERTENSION: Blood pressure has been consistently high and discussed that he does have hypertension and needs treatment pharmacologically and will not get better with just exercise at this level, he is doing somewhat better with his diet overall  He will start LOSARTAN 50 mg daily for this  3.  Diabetes with baseline A1c 8.7  He has not had much from it in his blood sugar control with using low-dose Trulicity Again irregular with follow-up and checking readings after meals as directed Not exercising Currently  A1c is 7.6 and discussed that this is not at target  Only recently has started improving his diet and he thinks his blood sugars are near normal  For now he will increase the dose of Trulicity for better efficacy and weight loss Discussed checking blood sugars after meals consistently and bring monitor for download, discussed blood sugar targets Consultation with dietitian will be ordered  4.  LIPIDS: Needs assessment of this with labs today  Counseling time on subjects discussed in assessment and plan sections is over 50% of today's 25 minute visit          Dorion Petillo 04/11/2017, 9:44 PM

## 2017-08-03 LAB — COMPREHENSIVE METABOLIC PANEL
A/G RATIO: 1.3 (ref 1.2–2.2)
ALK PHOS: 136 IU/L — AB (ref 39–117)
ALT: 24 IU/L (ref 0–44)
AST: 21 IU/L (ref 0–40)
Albumin: 4.3 g/dL (ref 3.5–5.5)
BUN/Creatinine Ratio: 19 (ref 9–20)
BUN: 15 mg/dL (ref 6–24)
CHLORIDE: 97 mmol/L (ref 96–106)
CO2: 24 mmol/L (ref 20–29)
Calcium: 9.8 mg/dL (ref 8.7–10.2)
Creatinine, Ser: 0.77 mg/dL (ref 0.76–1.27)
GFR calc Af Amer: 122 mL/min/{1.73_m2} (ref >59)
GFR, EST NON AFRICAN AMERICAN: 106 mL/min/{1.73_m2} (ref >59)
GLOBULIN, TOTAL: 3.2 g/dL (ref 1.5–4.5)
Glucose: 130 mg/dL — ABNORMAL HIGH (ref 65–99)
POTASSIUM: 4.5 mmol/L (ref 3.5–5.2)
Sodium: 137 mmol/L (ref 134–144)
Total Protein: 7.5 g/dL (ref 6.0–8.5)

## 2017-08-03 LAB — TESTOSTERONE, FREE, TOTAL, SHBG
Sex Hormone Binding: 31.7 nmol/L (ref 19.3–76.4)
TESTOSTERONE FREE: 7.7 pg/mL (ref 7.2–24.0)
Testosterone: 320 ng/dL (ref 264–916)

## 2017-08-03 LAB — LIPID PANEL
CHOL/HDL RATIO: 5.8 ratio — AB (ref 0.0–5.0)
CHOLESTEROL TOTAL: 216 mg/dL — AB (ref 100–199)
HDL: 37 mg/dL — AB (ref >39)
TRIGLYCERIDES: 477 mg/dL — AB (ref 0–149)

## 2017-08-03 LAB — LUTEINIZING HORMONE: LH: 7.4 m[IU]/mL (ref 1.7–8.6)

## 2017-08-04 ENCOUNTER — Telehealth: Payer: Self-pay

## 2017-08-04 NOTE — Telephone Encounter (Signed)
Called patient.  No answer.

## 2017-08-04 NOTE — Telephone Encounter (Signed)
-----   Message from Elayne Snare, MD sent at 08/04/2017  5:06 PM EST ----- Please let them know that the testosterone level is back to normal, however his triglycerides/fats are very high.  Start fenofibrate 145 mg daily, does not need clomiphene now

## 2017-08-05 NOTE — Telephone Encounter (Signed)
Called pt. No answer °

## 2017-08-18 ENCOUNTER — Telehealth: Payer: Self-pay

## 2017-08-18 NOTE — Telephone Encounter (Signed)
Called pt  No answer  Sent letter

## 2017-08-19 ENCOUNTER — Other Ambulatory Visit: Payer: Self-pay | Admitting: Endocrinology

## 2017-08-19 MED ORDER — FENOFIBRATE 145 MG PO TABS: 145.0000 mg | ORAL_TABLET | Freq: Every day | ORAL | 2 refills | Status: DC

## 2017-09-22 ENCOUNTER — Other Ambulatory Visit: Payer: Self-pay | Admitting: Endocrinology

## 2017-10-19 ENCOUNTER — Ambulatory Visit: Payer: BLUE CROSS/BLUE SHIELD | Admitting: Endocrinology

## 2017-11-17 ENCOUNTER — Encounter: Payer: Self-pay | Admitting: Endocrinology

## 2017-11-17 ENCOUNTER — Ambulatory Visit: Payer: BLUE CROSS/BLUE SHIELD | Admitting: Endocrinology

## 2017-11-17 VITALS — BP 134/82 | HR 84 | Ht 67.75 in | Wt 201.2 lb

## 2017-11-17 DIAGNOSIS — R7989 Other specified abnormal findings of blood chemistry: Secondary | ICD-10-CM

## 2017-11-17 DIAGNOSIS — E1165 Type 2 diabetes mellitus with hyperglycemia: Secondary | ICD-10-CM | POA: Diagnosis not present

## 2017-11-17 DIAGNOSIS — R030 Elevated blood-pressure reading, without diagnosis of hypertension: Secondary | ICD-10-CM | POA: Diagnosis not present

## 2017-11-17 DIAGNOSIS — E782 Mixed hyperlipidemia: Secondary | ICD-10-CM | POA: Diagnosis not present

## 2017-11-17 LAB — LIPID PANEL
Cholesterol: 152 mg/dL (ref 0–200)
HDL: 37.1 mg/dL — ABNORMAL LOW (ref >39.00)
LDL Cholesterol: 95 mg/dL (ref 0–99)
NonHDL: 114.44
Total CHOL/HDL Ratio: 4
Triglycerides: 99 mg/dL (ref 0.0–149.0)
VLDL: 19.8 mg/dL (ref 0.0–40.0)

## 2017-11-17 LAB — COMPREHENSIVE METABOLIC PANEL
ALBUMIN: 3.8 g/dL (ref 3.5–5.2)
ALT: 15 U/L (ref 0–53)
AST: 16 U/L (ref 0–37)
Alkaline Phosphatase: 95 U/L (ref 39–117)
BUN: 10 mg/dL (ref 6–23)
CO2: 29 mEq/L (ref 19–32)
CREATININE: 0.72 mg/dL (ref 0.40–1.50)
Calcium: 9 mg/dL (ref 8.4–10.5)
Chloride: 103 mEq/L (ref 96–112)
GFR: 148.2 mL/min (ref >60.00)
Glucose, Bld: 111 mg/dL — ABNORMAL HIGH (ref 70–99)
POTASSIUM: 4.1 meq/L (ref 3.5–5.1)
SODIUM: 138 meq/L (ref 135–145)
TOTAL PROTEIN: 7.2 g/dL (ref 6.0–8.3)
Total Bilirubin: 0.4 mg/dL (ref 0.2–1.2)

## 2017-11-17 LAB — GLUCOSE, POCT (MANUAL RESULT ENTRY): POC Glucose: 114 mg/dl — AB (ref 70–99)

## 2017-11-17 LAB — MICROALBUMIN / CREATININE URINE RATIO
Creatinine,U: 229.3 mg/dL
MICROALB UR: 0.9 mg/dL (ref 0.0–1.9)
Microalb Creat Ratio: 0.4 mg/g (ref 0.0–30.0)

## 2017-11-17 LAB — POCT GLYCOSYLATED HEMOGLOBIN (HGB A1C): HEMOGLOBIN A1C: 6.1

## 2017-11-17 LAB — TESTOSTERONE: TESTOSTERONE: 195.38 ng/dL — AB (ref 300.00–890.00)

## 2017-11-17 NOTE — Patient Instructions (Signed)
Check blood sugars on waking up  2/7  Also check blood sugars about 2 hours after a meal and do this after different meals by rotation  Recommended blood sugar levels on waking up is 90-130 and about 2 hours after meal is 130-160  Please bring your blood sugar monitor to each visit, thank you  

## 2017-11-17 NOTE — Progress Notes (Signed)
Patient ID: Adam Walker, male   DOB: [birth year], 52 y.o.   MRN: [id number]          Chief complaint: Follow-up    History of Present Illness   DIABETES:  A1c At baseline was 8.7 with fasting glucose 178 He was started on metformin but because of constipation with the metformin ER he was switched to Janumet Subsequently because of his difficulty with weight loss he was then switched to Trulicity  His A2Z is significantly better at 6.1 compared to 7.6   Current management, problems identified and blood sugars:   He has increased the dose of Trulicity up to 1.5 mg since his last visit in January  Also on his own he has been much more motivated to watch his diet with cutting out high fat foods, sweet drinks, sweets and a lot of carbohydrates  He has also started walking both outside and on treadmill very regularly  With this he has lost about 12 pounds  He is checking his blood sugars somewhat sporadically and they are mostly in the 90-110 range at home  He thinks he has occasional readings after meals also  No side effects with Trulicity 1.5 mg  Blood glucose recent blood sugar readings: Monitor at home: Generic  Recent readings 93-109, mostly checking in the afternoon   Wt Readings from Last 3 Encounters:  11/17/17 201 lb 3.2 oz (91.3 kg)  08/02/17 213 lb 12.8 oz (97 kg)  04/11/17 211 lb 6.4 oz (95.9 kg)   Lab Results  Component Value Date   HGBA1C 6.1 11/17/2017   HGBA1C 7.6 08/02/2017   HGBA1C 7.9 04/11/2017   Lab Results  Component Value Date   MICROALBUR 0.9 11/17/2017   [place] 95 11/17/2017   CREATININE 0.72 11/17/2017      Hypogonadismwas diagnosed in 10/2015  He has had complaints offatigue for a year or 2 which he blames on weight gain and poor diet.   However his main complaint on initial exam was  difficulty with erections for about 3 weeks . 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 he was started on clomiphene 25 mg 3 times a week, this was prescribed on 11/19/15 He had less fatigue and sexual dysfunction complaints with starting the clomiphene His follow-up testosterone done through his PCP was 387  Recent history:  For several months he has been not taking any medication and did not take clomiphene again as prescribed in 10/18 His testosterone level had improved in 1/19 with lifestyle changes Currently he has no difficulties with libido, fatigue or decreased motivation  Lab Results  Component Value Date   TESTOSTERONE 320 08/02/2017   TESTOSTERONE 229.87 (L) 12/08/2015   TESTOSTERONE 229 (L) 11/05/2015       OTHER active problems Including diabetes addressed today: See review of systems    Allergies as of 11/17/2017      Reactions   Bactrim [sulfamethoxazole-trimethoprim] Itching      Medication List        Accurate as of 11/17/17  9:58 AM. Always use your most recent med list.          fenofibrate 145 MG  tablet Commonly known as:  TRICOR Take 1 tablet (145 mg total) by mouth daily.   glucose blood test strip Commonly known as:  ONETOUCH VERIO Use to check blood sugar 1 time per day.   losartan 50 MG tablet Commonly known as:  COZAAR Take 1 tablet (50 mg total) by mouth daily.   TRULICITY 1.5 WL/7.9GX Sopn Generic drug:  Dulaglutide INJECT ONCE A WEEK       Past Medical History:  Diagnosis Date  . Hyperlipidemia    Cholesterol 239  . Uncontrolled type 2 diabetes mellitus with hyperglycemia, without long-term current use of insulin ([facility]) 08/02/2017   Family History  Problem Relation Age of Onset  . Diabetes Mother   . Stroke  Maternal Grandmother   . Stroke Paternal Grandmother      Social History:  reports that he has been smoking Cigars.  He has never used smokeless tobacco. His alcohol and drug histories are not on file.  Review of Systems       HYPERTENSION: His blood pressure had been consistently high  Since he was not being treated by his PCP is blood pressure was high at 144/95 on his last visit He was started on losartan 50 mg daily  However about a month ago he thought that this was causing erectile dysfunction and he stopped taking this  However appears that blood pressure is still not high without medication He has changed his diet significantly and eating healthier as well as losing weight and exercising    BP Readings from Last 3 Encounters:  11/17/17 134/82  08/02/17 (!) 144/95  04/11/17 (!) 154/108   LIPIDS:  His triglycerides are significantly high he is now on fenofibrate However previously was not watching his diet with a lot of high fat foods and carbohydrates which he has reduced significantly  Lab Results  Component Value Date   CHOL 152 11/17/2017   CHOL 216 (H) 08/02/2017   Lab Results  Component Value Date   HDL 37.10 (L) 11/17/2017   HDL 37 (L) 08/02/2017   Lab Results  Component Value Date   LDLCALC 95 11/17/2017   [place] Comment 08/02/2017   Lab Results  Component Value Date   TRIG 99.0 11/17/2017   TRIG 477 (H) 08/02/2017   Lab Results  Component Value Date   CHOLHDL 4 11/17/2017   CHOLHDL 5.8 (H) 08/02/2017   No results found for: LDLDIRECT   General Examination:   BP 134/82 (BP Location: Left Arm, Patient Position: Sitting, Cuff Size: Normal)   Pulse 84   Ht 5' 7.75" (1.721 m)   Wt 201 lb 3.2 oz (91.3 kg)   SpO2 98%   BMI 30.82 kg/m   No ankle edema    Assessment/ Plan:   1.  HYPERTENSION: Blood pressure has come down very significantly with his weight loss and improved diet and nearly normal Since he is going to continue  to improve lifestyle will continue to monitor without medications which he is reluctant to take now  3.  Diabetes with baseline A1c 8.7  See history of present illness for detailed discussion of current diabetes management, blood sugar patterns and problems identified  He has radically changed his diet and started an exercise program with 12 pound weight loss With this his A1c is 6.1 He is only on Trulicity currently His blood sugars are being monitored sporadically and not enough after meals but they appear to be normal  He will continue his regimen unchanged and also  1.5 mg Trulicity as before    4.  LIPIDS: High triglycerides have been treated with fenofibrate and needs assessment of this with labs today    Hypogonadotropic hypogonadism   His testosterone level had improved on his last visit with starting to lose weight Currently asymptomatic and will recheck testosterone  Although he had mild erectile dysfunction recently this may have been related to diabetes rather than medication side effect and he is not symptomatic at this time   Total visit time for evaluation and management of multiple problems and counseling =25 minutes      Maretta Overdorf 04/11/2017, 9:44 PM   ADDENDUM: Triglycerides normal and he can stop the fenofibrate

## 2017-11-18 ENCOUNTER — Other Ambulatory Visit: Payer: Self-pay | Admitting: Endocrinology

## 2017-11-18 MED ORDER — CLOMIPHENE CITRATE 50 MG PO TABS
ORAL_TABLET | ORAL | 1 refills | Status: DC
Start: 2017-11-18 — End: 2018-01-16

## 2017-11-21 ENCOUNTER — Other Ambulatory Visit: Payer: Self-pay | Admitting: Endocrinology

## 2017-11-23 ENCOUNTER — Other Ambulatory Visit: Payer: Self-pay | Admitting: Endocrinology

## 2017-12-09 ENCOUNTER — Encounter: Payer: Self-pay | Admitting: Gastroenterology

## 2018-01-16 ENCOUNTER — Ambulatory Visit (AMBULATORY_SURGERY_CENTER): Payer: Self-pay | Admitting: *Deleted

## 2018-01-16 VITALS — Ht 68.0 in | Wt 198.0 lb

## 2018-01-16 DIAGNOSIS — Z1211 Encounter for screening for malignant neoplasm of colon: Secondary | ICD-10-CM

## 2018-01-16 MED ORDER — PEG-KCL-NACL-NASULF-NA ASC-C 140 G PO SOLR: 1.0000 | Freq: Once | ORAL | 0 refills | Status: AC

## 2018-01-18 ENCOUNTER — Other Ambulatory Visit: Payer: Self-pay | Admitting: Endocrinology

## 2018-02-02 ENCOUNTER — Encounter: Payer: Self-pay | Admitting: Gastroenterology

## 2018-02-02 ENCOUNTER — Ambulatory Visit (AMBULATORY_SURGERY_CENTER): Payer: BLUE CROSS/BLUE SHIELD | Admitting: Gastroenterology

## 2018-02-02 VITALS — BP 158/93 | HR 80 | Temp 96.8°F | Resp 16 | Ht 68.0 in | Wt 198.0 lb

## 2018-02-02 DIAGNOSIS — Z1211 Encounter for screening for malignant neoplasm of colon: Secondary | ICD-10-CM | POA: Diagnosis present

## 2018-02-02 DIAGNOSIS — K621 Rectal polyp: Secondary | ICD-10-CM

## 2018-02-02 DIAGNOSIS — D128 Benign neoplasm of rectum: Secondary | ICD-10-CM

## 2018-02-02 DIAGNOSIS — K635 Polyp of colon: Secondary | ICD-10-CM

## 2018-02-02 DIAGNOSIS — D129 Benign neoplasm of anus and anal canal: Secondary | ICD-10-CM | POA: Diagnosis not present

## 2018-02-02 DIAGNOSIS — D125 Benign neoplasm of sigmoid colon: Secondary | ICD-10-CM | POA: Diagnosis not present

## 2018-02-02 MED ORDER — SODIUM CHLORIDE 0.9 % IV SOLN: 500.0000 mL | Freq: Once | INTRAVENOUS | Status: AC

## 2018-02-02 NOTE — Op Note (Signed)
[facility] Patient Name: Adam Walker Procedure Date: 02/02/2018 1:27 PM MRN: [id number] Endoscopist: [place]. Loletha Walker , MD Age: 52 Referring MD:  Date of Birth: 1966/04/12 Gender: Male Account #: [id number] Procedure:                Colonoscopy Indications:              Screening for colorectal malignant neoplasm, This                            is the patient's first colonoscopy Medicines:                Monitored Anesthesia Care Procedure:                Pre-Anesthesia Assessment:                           - Prior to the procedure, a History and Physical                            was performed, and patient medications and                            allergies were reviewed. The patient's tolerance of                            previous anesthesia was also reviewed. The risks                            and benefits of the procedure and the sedation                            options and risks were discussed with the patient.                            All questions were answered, and informed consent                            was obtained. Prior Anticoagulants: The patient has                            taken no previous anticoagulant or antiplatelet                            agents. ASA Grade Assessment: II - A patient with                            mild systemic disease. After reviewing the risks                            and benefits, the patient was deemed in                            satisfactory condition to undergo the procedure.                           -  Prior to the procedure, a History and Physical                            was performed, and patient medications and                            allergies were reviewed. The patient's tolerance of                            previous anesthesia was also reviewed. The risks                            and benefits of the procedure and the sedation                            options and risks were discussed with the  patient.                            All questions were answered, and informed consent                            was obtained. Prior Anticoagulants: The patient has                            taken no previous anticoagulant or antiplatelet                            agents. ASA Grade Assessment: II - A patient with                            mild systemic disease. After reviewing the risks                            and benefits, the patient was deemed in                            satisfactory condition to undergo the procedure.                           After obtaining informed consent, the colonoscope                            was passed under direct vision. Throughout the                            procedure, the patient's blood pressure, pulse, and                            oxygen saturations were monitored continuously. The                            Colonoscope was introduced through the anus and  advanced to the the terminal ileum, with                            identification of the appendiceal orifice and IC                            valve. The colonoscopy was performed without                            difficulty. The patient tolerated the procedure                            well. The quality of the bowel preparation was                            good. The ileocecal valve, appendiceal orifice, and                            rectum were photographed. Scope In: 1:39:39 PM Scope Out: 1:55:29 PM Scope Withdrawal Time: 0 hours 13 minutes 29 seconds  Total Procedure Duration: 0 hours 15 minutes 50 seconds  Findings:                 The perianal and digital rectal examinations were                            normal.                           Two sessile polyps were found in the rectum and                            sigmoid colon. The polyps were 4 mm in size. The                            rectal polyp was white. These polyps were removed                             with a cold snare. Resection and retrieval were                            complete.                           The exam was otherwise without abnormality on                            direct and retroflexion views. Complications:            No immediate complications. Estimated Blood Loss:     Estimated blood loss was minimal. Impression:               - Two 4 mm polyps in the rectum and in the sigmoid  colon, removed with a cold snare. Resected and                            retrieved.                           - The examination was otherwise normal on direct                            and retroflexion views. Recommendation:           - Patient has a contact number available for                            emergencies. The signs and symptoms of potential                            delayed complications were discussed with the                            patient. Return to normal activities tomorrow.                            Written discharge instructions were provided to the                            patient.                           - Resume previous diet.                           - Continue present medications.                           - Await pathology results.                           - Repeat colonoscopy is recommended for                            surveillance. The colonoscopy date will be                            determined after pathology results from today's                            exam become available for review. Adam Harold L. Loletha Carrow, MD 02/02/2018 2:03:40 PM This report has been signed electronically.

## 2018-02-02 NOTE — Patient Instructions (Signed)
Impression/Recommendations:  Polyp handout given to patient.  Resume previous diet. Continue present medications.  Repeat colonoscopy recommended for surveillance.  Date to be determined after pathology results reviewed.  YOU HAD AN ENDOSCOPIC PROCEDURE TODAY AT THE [facility]:   Refer to the procedure report that was given to you for any specific questions about what was found during the examination.  If the procedure report does not answer your questions, please call your gastroenterologist to clarify.  If you requested that your care partner not be given the details of your procedure findings, then the procedure report has been included in a sealed envelope for you to review at your convenience later.  YOU SHOULD EXPECT: Some feelings of bloating in the abdomen. Passage of more gas than usual.  Walking can help get rid of the air that was put into your GI tract during the procedure and reduce the bloating. If you had a lower endoscopy (such as a colonoscopy or flexible sigmoidoscopy) you may notice spotting of blood in your stool or on the toilet paper. If you underwent a bowel prep for your procedure, you may not have a normal bowel movement for a few days.  Please Note:  You might notice some irritation and congestion in your nose or some drainage.  This is from the oxygen used during your procedure.  There is no need for concern and it should clear up in a day or so.  SYMPTOMS TO REPORT IMMEDIATELY:   Following lower endoscopy (colonoscopy or flexible sigmoidoscopy):  Excessive amounts of blood in the stool  Significant tenderness or worsening of abdominal pains  Swelling of the abdomen that is new, acute  Fever of 100F or higher For urgent or emergent issues, a gastroenterologist can be reached at any hour by calling [phone].   DIET:  We do recommend a small meal at first, but then you may proceed to your regular diet.  Drink plenty of fluids but you should  avoid alcoholic beverages for 24 hours.  ACTIVITY:  You should plan to take it easy for the rest of today and you should NOT DRIVE or use heavy machinery until tomorrow (because of the sedation medicines used during the test).    FOLLOW UP: Our staff will call the number listed on your records the next business day following your procedure to check on you and address any questions or concerns that you may have regarding the information given to you following your procedure. If we do not reach you, we will leave a message.  However, if you are feeling well and you are not experiencing any problems, there is no need to return our call.  We will assume that you have returned to your regular daily activities without incident.  If any biopsies were taken you will be contacted by phone or by letter within the next 1-3 weeks.  Please call us at [phone] if you have not heard about the biopsies in 3 weeks.    SIGNATURES/CONFIDENTIALITY: You and/or your care partner have signed paperwork which will be entered into your electronic medical record.  These signatures attest to the fact that that the information above on your After Visit Summary has been reviewed and is understood.  Full responsibility of the confidentiality of this discharge information lies with you and/or your care-partner. 

## 2018-02-02 NOTE — Progress Notes (Signed)
Called to room to assist during endoscopic procedure.  Patient ID and intended procedure confirmed with present staff. Received instructions for my participation in the procedure from the performing physician.  

## 2018-02-02 NOTE — Progress Notes (Signed)
Pt's states no medical or surgical changes since previsit or office visit. 

## 2018-02-03 ENCOUNTER — Telehealth: Payer: Self-pay | Admitting: *Deleted

## 2018-02-03 NOTE — Telephone Encounter (Signed)
  Follow up Call-  Call back number 02/02/2018  Post procedure Call Back phone  # [phone]  Permission to leave phone message Yes     Patient questions:  Do you have a fever, pain , or abdominal swelling? No. Pain Score  0 *  Have you tolerated food without any problems? Yes.    Have you been able to return to your normal activities? Yes.    Do you have any questions about your discharge instructions: Diet   No. Medications  No. Follow up visit  No.  Do you have questions or concerns about your Care? No.  Actions: * If pain score is 4 or above: No action needed, pain <4.

## 2018-02-10 ENCOUNTER — Encounter: Payer: Self-pay | Admitting: Gastroenterology

## 2018-03-23 ENCOUNTER — Encounter: Payer: Self-pay | Admitting: Endocrinology

## 2018-03-23 ENCOUNTER — Ambulatory Visit: Payer: BLUE CROSS/BLUE SHIELD | Admitting: Endocrinology

## 2018-03-23 VITALS — BP 149/93 | HR 78 | Ht 68.0 in | Wt 199.2 lb

## 2018-03-23 DIAGNOSIS — I10 Essential (primary) hypertension: Secondary | ICD-10-CM

## 2018-03-23 DIAGNOSIS — E782 Mixed hyperlipidemia: Secondary | ICD-10-CM

## 2018-03-23 DIAGNOSIS — E1165 Type 2 diabetes mellitus with hyperglycemia: Secondary | ICD-10-CM

## 2018-03-23 DIAGNOSIS — E230 Hypopituitarism: Secondary | ICD-10-CM

## 2018-03-23 LAB — CBC WITH DIFFERENTIAL/PLATELET
BASOS PCT: 0.7 % (ref 0.0–3.0)
Basophils Absolute: 0.1 10*3/uL (ref 0.0–0.1)
EOS PCT: 1.8 % (ref 0.0–5.0)
Eosinophils Absolute: 0.1 10*3/uL (ref 0.0–0.7)
HEMATOCRIT: 48 % (ref 39.0–52.0)
HEMOGLOBIN: 16.4 g/dL (ref 13.0–17.0)
LYMPHS PCT: 24.1 % (ref 12.0–46.0)
Lymphs Abs: 1.9 10*3/uL (ref 0.7–4.0)
MCHC: 34.2 g/dL (ref 30.0–36.0)
MCV: 92.9 fl (ref 78.0–100.0)
MONOS PCT: 8.6 % (ref 3.0–12.0)
Monocytes Absolute: 0.7 10*3/uL (ref 0.1–1.0)
Neutro Abs: 5.1 10*3/uL (ref 1.4–7.7)
Neutrophils Relative %: 64.8 % (ref 43.0–77.0)
Platelets: 267 10*3/uL (ref 150.0–400.0)
RBC: 5.16 Mil/uL (ref 4.22–5.81)
RDW: 13.1 % (ref 11.5–15.5)
WBC: 7.9 10*3/uL (ref 4.0–10.5)

## 2018-03-23 LAB — COMPREHENSIVE METABOLIC PANEL
ALT: 16 U/L (ref 0–53)
AST: 16 U/L (ref 0–37)
Albumin: 4 g/dL (ref 3.5–5.2)
Alkaline Phosphatase: 99 U/L (ref 39–117)
BUN: 14 mg/dL (ref 6–23)
CALCIUM: 9.1 mg/dL (ref 8.4–10.5)
CHLORIDE: 105 meq/L (ref 96–112)
CO2: 30 meq/L (ref 19–32)
Creatinine, Ser: 0.78 mg/dL (ref 0.40–1.50)
GFR: 134.94 mL/min (ref >60.00)
GLUCOSE: 120 mg/dL — AB (ref 70–99)
POTASSIUM: 4.1 meq/L (ref 3.5–5.1)
Sodium: 141 mEq/L (ref 135–145)
Total Bilirubin: 0.5 mg/dL (ref 0.2–1.2)
Total Protein: 7.3 g/dL (ref 6.0–8.3)

## 2018-03-23 LAB — POCT GLYCOSYLATED HEMOGLOBIN (HGB A1C): Hemoglobin A1C: 5.7 % — AB (ref 4.0–5.6)

## 2018-03-23 LAB — LIPID PANEL
CHOL/HDL RATIO: 4
Cholesterol: 171 mg/dL (ref 0–200)
HDL: 38.6 mg/dL — ABNORMAL LOW (ref >39.00)
LDL CALC: 104 mg/dL — AB (ref 0–99)
NONHDL: 132.73
Triglycerides: 145 mg/dL (ref 0.0–149.0)
VLDL: 29 mg/dL (ref 0.0–40.0)

## 2018-03-23 LAB — TESTOSTERONE: Testosterone: 384.23 ng/dL (ref 300.00–890.00)

## 2018-03-23 NOTE — Progress Notes (Signed)
Patient ID: Adam Walker, male   DOB: [birth year], 52 y.o.   MRN: [id number]          Chief complaint: Endocrinology follow-up    History of Present Illness   DIABETES type II:  A1c At baseline was 8.7 with fasting glucose 178 He was started on metformin but because of constipation with the metformin ER he was switched to Janumet Subsequently because of his difficulty with weight loss he was then switched to Trulicity  His V3X is significantly better and now 5.7, previously 2.1   Current management, problems identified and blood sugars:   He has not checked his sugars more than once a week and not sure what meter he is using  No side effects from Trulicity, taking 1.5 mg weekly since January and is compliant with this  He says he is not exercising because it is too hot  Weight has leveled off  He does think that he is still trying to watch his high fat foods and sweets as well as sweet drinks again   Blood glucose recent blood sugar readings: Monitor at home: Generic  Recent readings 78-129, some in the evening   Wt Readings from Last 3 Encounters:  03/23/18 199 lb 3.2 oz (90.4 kg)  02/02/18 198 lb (89.8 kg)  01/16/18 198 lb (89.8 kg)   Lab Results  Component Value Date   HGBA1C 5.7 (A) 03/23/2018   HGBA1C 6.1 11/17/2017   HGBA1C 7.6 08/02/2017   Lab Results  Component Value Date   MICROALBUR 0.9 11/17/2017   [place] 95 11/17/2017   CREATININE 0.72 11/17/2017      Hypogonadismwas diagnosed in 10/2015  He has had baseline complaints offatigue for a year or 2  However his main complaint on initial consultation was  difficulty with erections for about 3 weeks . 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 25 mg 3 times a week, this was prescribed on 11/19/15 He had less fatigue and sexual dysfunction complaints with starting the clomiphene His follow-up testosterone done through his PCP was 387  Recent history:  On his visit in 5/19 he had not been on any medication Despite his weight loss his testosterone level is low but he was not complaining of any unusual fatigue He was recommended clomiphene and this was prescribed but he did not take it and he is now going to an unknown physician He says he was told to start taking testosterone injections, apparently taking 100 mg weekly He thinks he is feeling much better with his energy level and has no issues with libido No records are available and not clear what his last level was He takes his injection every Wednesday   Lab Results  Component Value Date   TESTOSTERONE 195.38 (L) 11/17/2017   TESTOSTERONE 320 08/02/2017   TESTOSTERONE 229.87 (L) 12/08/2015   TESTOSTERONE 229 (L) 11/05/2015       OTHER active problems Including diabetes addressed today: See review of systems    Allergies as of 03/23/2018      Reactions   Bactrim [sulfamethoxazole-trimethoprim] Itching      Medication List  Accurate as of 03/23/18 10:37 AM. Always use your most recent med list.          glucose blood test strip Use to check blood sugar 1 time per day.   tadalafil 5 MG tablet Commonly known as:  CIALIS Take 5 mg by mouth daily.   tamsulosin 0.4 MG Caps capsule Commonly known as:  FLOMAX Take 0.4 mg by mouth at bedtime.   testosterone cypionate 200 MG/ML injection Commonly known as:  DEPOTESTOSTERONE CYPIONATE   TRULICITY 1.5 NW/2.9FA Sopn Generic drug:  Dulaglutide INJECT 1.5 MG ONCE WEEKLY       Past Medical History:  Diagnosis Date  . Hyperlipidemia    Cholesterol 239  .  Seasonal allergies   . Uncontrolled type 2 diabetes mellitus with hyperglycemia, without long-term current use of insulin ([place]) 08/02/2017   Family History  Problem Relation Age of Onset  . Diabetes Mother   . Stroke Maternal Grandmother   . Stroke Paternal Grandmother   . Colon cancer Neg Hx   . Esophageal cancer Neg Hx   . Rectal cancer Neg Hx   . Stomach cancer Neg Hx      Social History:  reports that he has been smoking Cigars.  He has never used smokeless tobacco. His alcohol and drug histories are not on file.  Review of Systems      HYPERTENSION: His blood pressure had been consistently high   Since he was not being treated by his PCP is blood pressure was high at 144/95 on his last visit He was started on losartan 50 mg daily but since he thought that this was causing erectile dysfunction and he stopped taking this   He thinks he is not having any high blood pressure readings at home with diastolics about 21-30 and he says he has seen his PCP in July Blood pressure was high done twice in the office  BP Readings from Last 3 Encounters:  03/23/18 (!) 149/93  02/02/18 (!) 158/93  11/17/17 134/82   LIPIDS:  His triglycerides are significantly better on his last visit and he was told to leave off his fenofibrate His improvement was secondary to weight loss and significant improvement in diet  Lab Results  Component Value Date   CHOL 152 11/17/2017   CHOL 216 (H) 08/02/2017   Lab Results  Component Value Date   HDL 37.10 (L) 11/17/2017   HDL 37 (L) 08/02/2017   Lab Results  Component Value Date   LDLCALC 95 11/17/2017   [place] Comment 08/02/2017   Lab Results  Component Value Date   TRIG 99.0 11/17/2017   TRIG 477 (H) 08/02/2017   Lab Results  Component Value Date   CHOLHDL 4 11/17/2017   CHOLHDL 5.8 (H) 08/02/2017   No results found for: LDLDIRECT   General Examination:   BP (!) 149/93 (BP Location: Left Arm, Cuff Size: Normal)   Pulse 78    Ht 5\' 8"  (1.727 m)   Wt 199 lb 3.2 oz (90.4 kg)   SpO2 99%   BMI 30.29 kg/m   Repeat blood pressure was still high, totally 3 times in the office    Assessment/ Plan:   1.  HYPERTENSION: Blood pressure has gone back up including the measurement last month but he thinks his blood pressure is much better at home and his PCP has not started him back on any medication He thinks he gets anxious coming here   He will continue to monitor at home and  with his PCP Also recommended regular exercise   3.  Diabetes with baseline A1c 8.7  See history of present illness for detailed discussion of current diabetes management, blood sugar patterns and problems identified  He has maintained his weight loss and his A1c is even better at 5.7 However checking only sporadically at home Advised him to bring his monitor for download He does need to start exercising interested in any further weight loss Does need to check more readings after meals   4.  LIPIDS:  needs assessment of high triglycerides with labs today    Hypogonadotropic hypogonadism   His testosterone level was low normal but he was not complaining of any fatigue He has been taking injections from an unknown physician, possibly a urologist and no records available He also has a remote history of taking anabolic steroids for bodybuilding in the past  Discussed that if he does need testosterone supplementation which would be better done with transdermal preparations to keep the levels more evenly controlled and avoid peaks and valleys with the injections We will need to check his labs again today and also monitor his CBC   Total visit time for evaluation and management of multiple problems and counseling =25 minutes      Joellyn Grandt 04/11/2017, 9:44 PM

## 2018-06-15 ENCOUNTER — Ambulatory Visit: Payer: BLUE CROSS/BLUE SHIELD | Admitting: Endocrinology

## 2019-06-02 ENCOUNTER — Ambulatory Visit (HOSPITAL_COMMUNITY)
Admission: EM | Admit: 2019-06-02 | Discharge: 2019-06-02 | Disposition: A | Discharge status: Home / Self Care | Payer: BC Managed Care – PPO

## 2019-06-02 ENCOUNTER — Emergency Department (HOSPITAL_COMMUNITY)
Admission: EM | Admit: 2019-06-02 | Discharge: 2019-06-02 | Disposition: A | Discharge status: Home / Self Care | Payer: BC Managed Care – PPO | Attending: Emergency Medicine | Admitting: Emergency Medicine

## 2019-06-02 ENCOUNTER — Encounter (HOSPITAL_COMMUNITY): Payer: Self-pay | Admitting: Emergency Medicine

## 2019-06-02 ENCOUNTER — Emergency Department (HOSPITAL_COMMUNITY): Payer: BC Managed Care – PPO

## 2019-06-02 ENCOUNTER — Other Ambulatory Visit: Payer: Self-pay

## 2019-06-02 DIAGNOSIS — F17290 Nicotine dependence, other tobacco product, uncomplicated: Secondary | ICD-10-CM | POA: Insufficient documentation

## 2019-06-02 DIAGNOSIS — E119 Type 2 diabetes mellitus without complications: Secondary | ICD-10-CM | POA: Insufficient documentation

## 2019-06-02 DIAGNOSIS — Z79899 Other long term (current) drug therapy: Secondary | ICD-10-CM | POA: Diagnosis not present

## 2019-06-02 DIAGNOSIS — S01512A Laceration without foreign body of oral cavity, initial encounter: Secondary | ICD-10-CM

## 2019-06-02 DIAGNOSIS — R55 Syncope and collapse: Secondary | ICD-10-CM | POA: Diagnosis not present

## 2019-06-02 DIAGNOSIS — Z7984 Long term (current) use of oral hypoglycemic drugs: Secondary | ICD-10-CM | POA: Insufficient documentation

## 2019-06-02 LAB — BASIC METABOLIC PANEL
Anion gap: 9 (ref 5–15)
BUN: 9 mg/dL (ref 6–20)
CO2: 29 mmol/L (ref 22–32)
Calcium: 9.3 mg/dL (ref 8.9–10.3)
Chloride: 100 mmol/L (ref 98–111)
Creatinine, Ser: 0.85 mg/dL (ref 0.61–1.24)
GFR calc Af Amer: >60 mL/min (ref >60)
GFR calc non Af Amer: >60 mL/min (ref >60)
Glucose, Bld: 102 mg/dL — ABNORMAL HIGH (ref 70–99)
Potassium: 4.2 mmol/L (ref 3.5–5.1)
Sodium: 138 mmol/L (ref 135–145)

## 2019-06-02 LAB — CBC
HCT: 53.3 % — ABNORMAL HIGH (ref 39.0–52.0)
Hemoglobin: 18.5 g/dL — ABNORMAL HIGH (ref 13.0–17.0)
MCH: 32.4 pg (ref 26.0–34.0)
MCHC: 34.7 g/dL (ref 30.0–36.0)
MCV: 93.3 fL (ref 80.0–100.0)
Platelets: 233 10*3/uL (ref 150–400)
RBC: 5.71 MIL/uL (ref 4.22–5.81)
RDW: 12 % (ref 11.5–15.5)
WBC: 9.4 10*3/uL (ref 4.0–10.5)
nRBC: 0 % (ref 0.0–0.2)

## 2019-06-02 LAB — CBG MONITORING, ED: Glucose-Capillary: 82 mg/dL (ref 70–99)

## 2019-06-02 MED ORDER — SODIUM CHLORIDE 0.9% FLUSH: 3.0000 mL | Freq: Once | INTRAVENOUS | Status: DC

## 2019-06-02 NOTE — ED Provider Notes (Signed)
[facility] Provider Note   CSN: [id number] Arrival date & time: 06/02/19  1316     History   Chief Complaint Chief Complaint  Patient presents with  . Loss of Consciousness    HPI Adam Walker is a 53 y.o. male.     53 year old male presents for evaluation of her syncopal episode today.  Patient states that he rolled out of bed to go to the bathroom and accidentally rolled out onto the floor.  Patient was able to stand up right away, reports take it very quickly and while headed to the bathroom felt suddenly lightheaded like "the blood was pouring out of me" and passed out, falling to the floor.  Patient reports loss of consciousness for a few seconds, his girlfriend was present at the time of the injury.  Patient reports laceration inside his lower lip, no dental injury.  EMS was called, patient was found to be hypertensive with a blood pressure of 200/100.  Patient went to urgent care however due to complaint of syncopal episode was redirected to the emergency room.  Patient states that he feels fine at this time and has no complaints.  Denies ever feeling chest pain, shortness of breath, denies any extremity weakness, numbness.  No history of similar meds previously.  Patient is prescribed Cialis, did have intercourse prior to his episode today, did not take his Cialis today.  Patient attributes his event and elevated blood pressure due to poor diet recently, states that he has been eating a lot of food over the course of the holiday that he normally would not eat.     Past Medical History:  Diagnosis Date  . Hyperlipidemia    Cholesterol 239  . Seasonal allergies   . Uncontrolled type 2 diabetes mellitus with hyperglycemia, without long-term current use of insulin ([place]) 08/02/2017    Patient Active Problem List   Diagnosis Date Noted  . Uncontrolled type 2 diabetes mellitus with hyperglycemia, without long-term current use of insulin  ([place]) 08/02/2017  . Low serum testosterone 10/27/2015    Past Surgical History:  Procedure Laterality Date  . boil removal     . NASAL SINUS SURGERY  2007        Home Medications    Prior to Admission medications   Medication Sig Start Date End Date Taking? Authorizing Provider  glucose blood (ONETOUCH VERIO) test strip Use to check blood sugar 1 time per day. 02/26/16   Elayne Snare, MD  tadalafil (CIALIS) 5 MG tablet Take 5 mg by mouth daily. 12/31/17   [provider]  tamsulosin (FLOMAX) 0.4 MG CAPS capsule Take 0.4 mg by mouth at bedtime. 12/31/17   [provider]  testosterone cypionate (DEPOTESTOSTERONE CYPIONATE) 200 MG/ML injection  01/16/18   [provider]  TRULICITY 1.5 0000000 SOPN INJECT 1.5 MG ONCE WEEKLY 01/18/18   Elayne Snare, MD    Family History Family History  Problem Relation Age of Onset  . Diabetes Mother   . Stroke Maternal Grandmother   . Stroke Paternal Grandmother   . Colon cancer Neg Hx   . Esophageal cancer Neg Hx   . Rectal cancer Neg Hx   . Stomach cancer Neg Hx     Social History Social History   Tobacco Use  . Smoking status: Current Some Day Smoker    Packs/day: 0.25    Types: Cigars  . Smokeless tobacco: Never Used  Substance Use Topics  . Alcohol use: Not on  file    Comment: rare  . Drug use: Never     Allergies   Bactrim [sulfamethoxazole-trimethoprim]   Review of Systems Review of Systems  Constitutional: Negative for fever.  Eyes: Negative for visual disturbance.  Respiratory: Negative for shortness of breath.   Cardiovascular: Negative for chest pain.  Gastrointestinal: Negative for abdominal pain, constipation, diarrhea, nausea and vomiting.  Musculoskeletal: Negative for gait problem, neck pain and neck stiffness.  Skin: Positive for wound. Negative for rash.  Allergic/Immunologic: Positive for immunocompromised state.  Neurological: Positive for syncope. Negative for dizziness, weakness  and headaches.  Hematological: Does not bruise/bleed easily.  All other systems reviewed and are negative.    Physical Exam Updated Vital Signs BP (!) 165/110   Pulse 98   Resp 16   SpO2 98%   Physical Exam Vitals signs and nursing note reviewed.  Constitutional:      General: He is not in acute distress.    Appearance: He is well-developed. He is not diaphoretic.  HENT:     Head: Normocephalic and atraumatic.     Mouth/Throat:     Mouth: Mucous membranes are moist.   Eyes:     Extraocular Movements: Extraocular movements intact.     Pupils: Pupils are equal, round, and reactive to light.  Neck:     Musculoskeletal: Normal range of motion and neck supple. No neck rigidity or muscular tenderness.  Cardiovascular:     Rate and Rhythm: Normal rate and regular rhythm.     Pulses: Normal pulses.     Heart sounds: Normal heart sounds.  Pulmonary:     Effort: Pulmonary effort is normal.     Breath sounds: Normal breath sounds.  Musculoskeletal:        General: No swelling, tenderness, deformity or signs of injury.     Right lower leg: No edema.     Left lower leg: No edema.  Lymphadenopathy:     Cervical: No cervical adenopathy.  Skin:    General: Skin is warm and dry.     Findings: No erythema or rash.  Neurological:     Mental Status: He is alert and oriented to person, place, and time.  Psychiatric:        Behavior: Behavior normal.      ED Treatments / Results  Labs (all labs ordered are listed, but only abnormal results are displayed) Labs Reviewed  BASIC METABOLIC PANEL - Abnormal; Notable for the following components:      Result Value   Glucose, Bld 102 (*)    All other components within normal limits  CBC - Abnormal; Notable for the following components:   Hemoglobin 18.5 (*)    HCT 53.3 (*)    All other components within normal limits  URINALYSIS, ROUTINE W REFLEX MICROSCOPIC  CBG MONITORING, ED    EKG EKG Interpretation  Date/Time:  Saturday  June 02 2019 13:54:24 EST Ventricular Rate:  96 PR Interval:  150 QRS Duration: 92 QT Interval:  324 QTC Calculation: 409 R Axis:   -72 Text Interpretation: Normal sinus rhythm Left anterior fascicular block Nonspecific T wave abnormality Abnormal ECG no wpw, prolonged qt or brugada No old tracing to compare Confirmed by Deno Etienne [phone]) on 06/02/2019 2:00:24 PM   Radiology Dg Chest 2 View  Result Date: 06/02/2019 CLINICAL DATA:  Hypertension and syncope EXAM: CHEST - 2 VIEW COMPARISON:  None. FINDINGS: Lungs are clear. Heart size and pulmonary vascularity are normal. No adenopathy. No pneumothorax. No bone  lesions. IMPRESSION: No edema or consolidation. Electronically Signed   By: Lowella Grip III M.D.   On: 06/02/2019 15:14    Procedures Procedures (including critical care time)  Medications Ordered in ED Medications  sodium chloride flush (NS) 0.9 % injection 3 mL (3 mLs Intravenous Not Given 06/02/19 1447)     Initial Impression / Assessment and Plan / ED Course  I have reviewed the triage vital signs and the nursing notes.  Pertinent labs & imaging results that were available during my care of the patient were reviewed by me and considered in my medical decision making (see chart for details).  Clinical Course as of Jun 01 1518  Sat Jun 01, 2676  [id number] 53 year old male presents for evaluation of her syncopal episode this morning, no complaints at this time other than laceration to his inner lower lip.  Exam is unremarkable with exception of simple laceration to the inner lower lip, recommend patient rinse with salt water after every meal.  Lab work including CBC and BMP is unremarkable, urinalysis pending as well as chest x-ray.  Patient's blood pressure is elevated today, he is not orthostatic.   [LM]  1518 Orthostatic vital signs negative.  CXR unremarkable, view of prior note with endocrinology, patient does not medicated for his high blood pressure, states that  his blood pressure is only elevated with doctors.  Advised patient to monitor his blood pressure at home and follow-up with PCP if blood pressure remains elevated.  Return to ER for any new or worsening symptoms.   [LM]    Clinical Course User Index [LM] Tacy Learn, PA-C      Final Clinical Impressions(s) / ED Diagnoses   Final diagnoses:  Syncope, unspecified syncope type  Laceration of internal mouth, initial encounter    ED Discharge Orders    None       Tacy Learn, PA-C 06/02/19 [place], DO 06/02/19 1523

## 2019-06-02 NOTE — ED Triage Notes (Addendum)
Pt here for eval of syncopal episode this morning. States he got out of bed after intercourse and  Passed out. felt dizzy afterwards. EMS evaluated the patient and told to have a normal CBG but he was hypertensive. Pt has swelling to left eyelid, lac to lower lip with some oral trauma from the fall. Pt also reports hx of back pain that now feels worse to left lower back. No dizziness currently.

## 2019-06-02 NOTE — ED Notes (Signed)
Patient transported to X-ray 

## 2019-06-02 NOTE — ED Triage Notes (Signed)
Pt and spouse reported pt having syncopal episode this AM; called EMS.  Pt CBG checked by EMS - was WNL.  Bp 200s/110s.  Pt refused EMS transfer to hospital. Discussed with Rondel Oh, NP.  Per NP instructions, pt needs to be seen in ED.  Pt & spouse verbalized understanding.

## 2019-06-02 NOTE — Discharge Instructions (Signed)
Rinse and spit salt water or listerine after every meal. Monitor your blood pressure, watch your diet. Record BP readings and take to follow up with your PCP. Return to the ER for any chest pain, further episodes of passing out, any other concerning symptoms.

## 2021-05-26 IMAGING — DX DG CHEST 2V
2 series · 2 of 2 positions shown · non-contrast
Comparison: None.

CLINICAL DATA: Hypertension and syncope

EXAM:
CHEST - 2 VIEW

[chest pa]
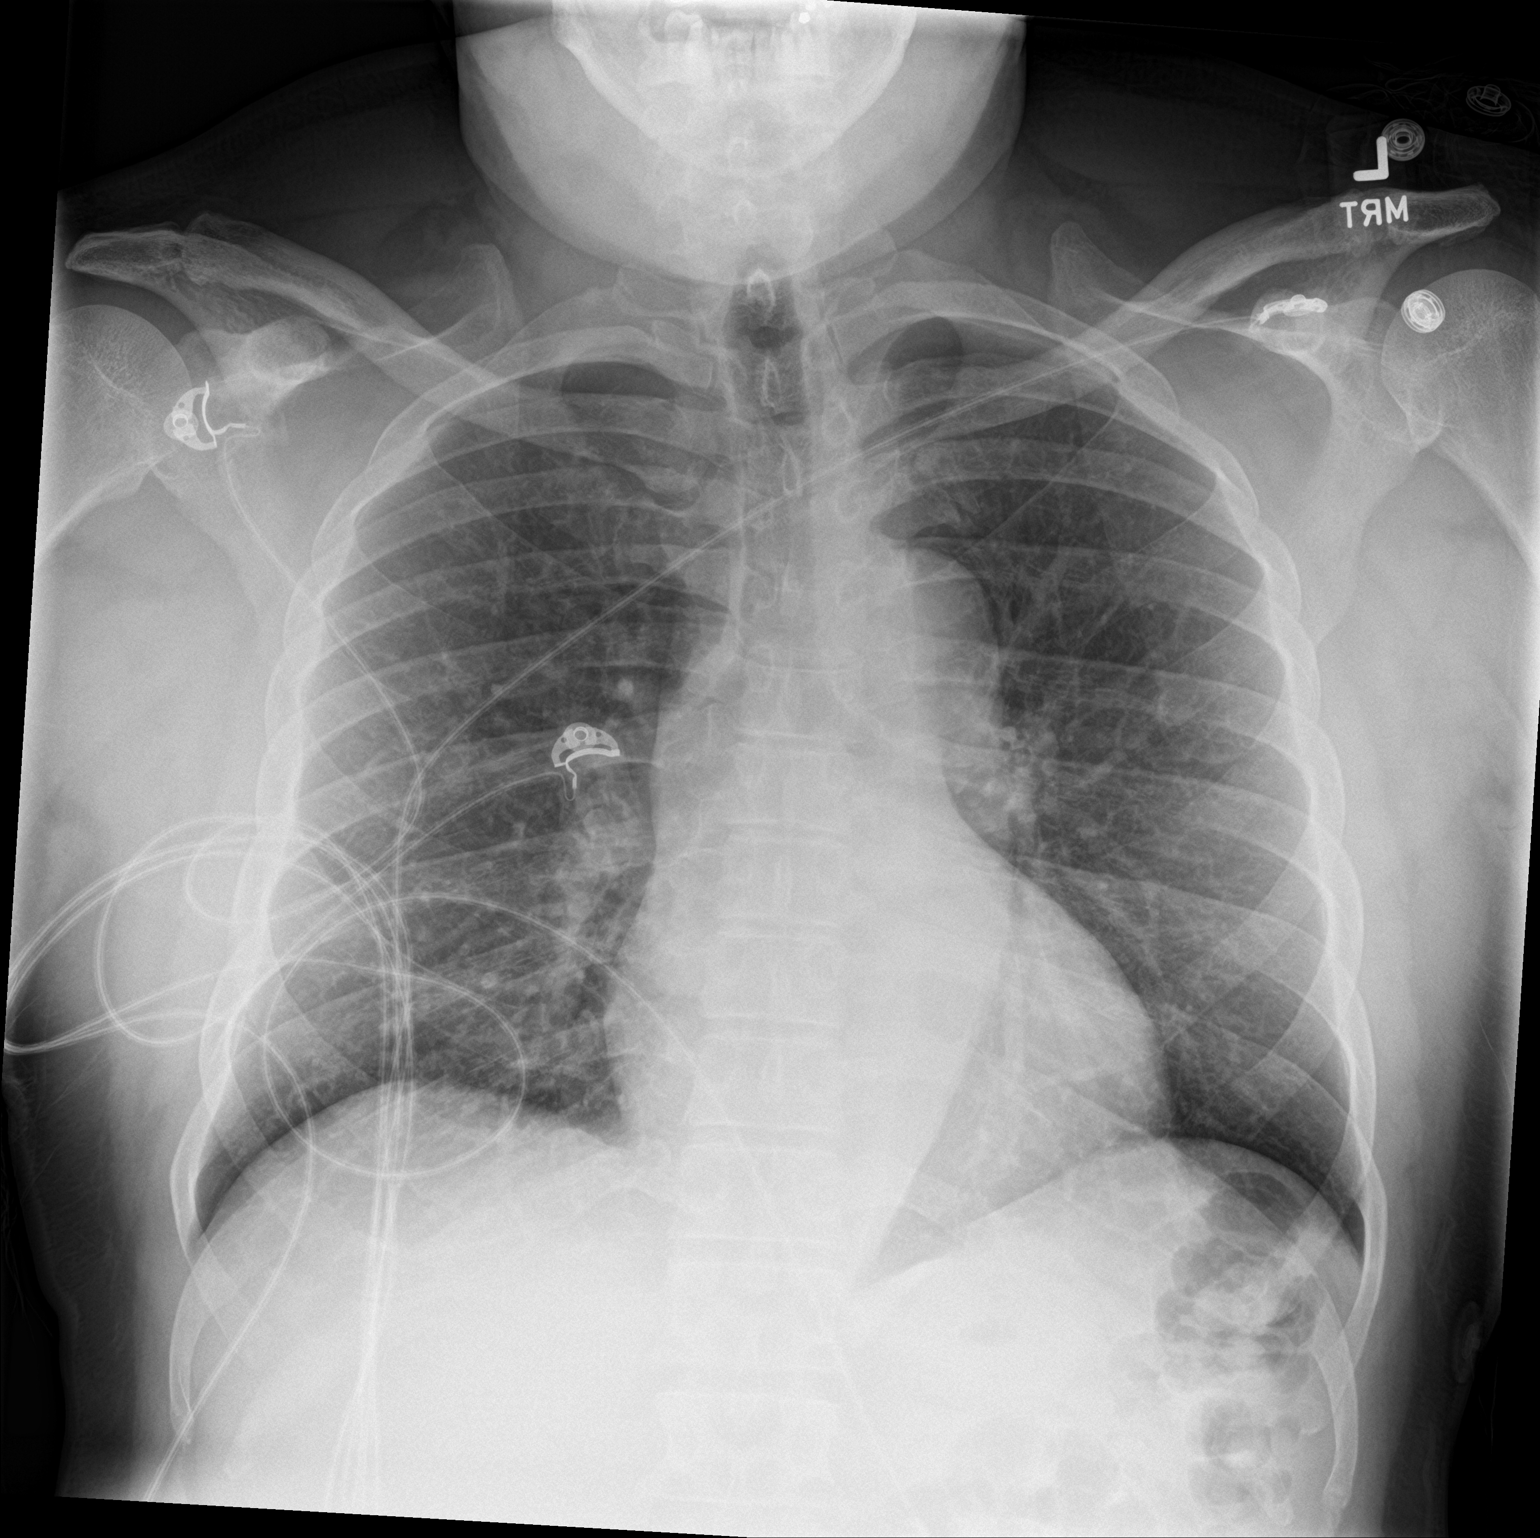

[chest lat]
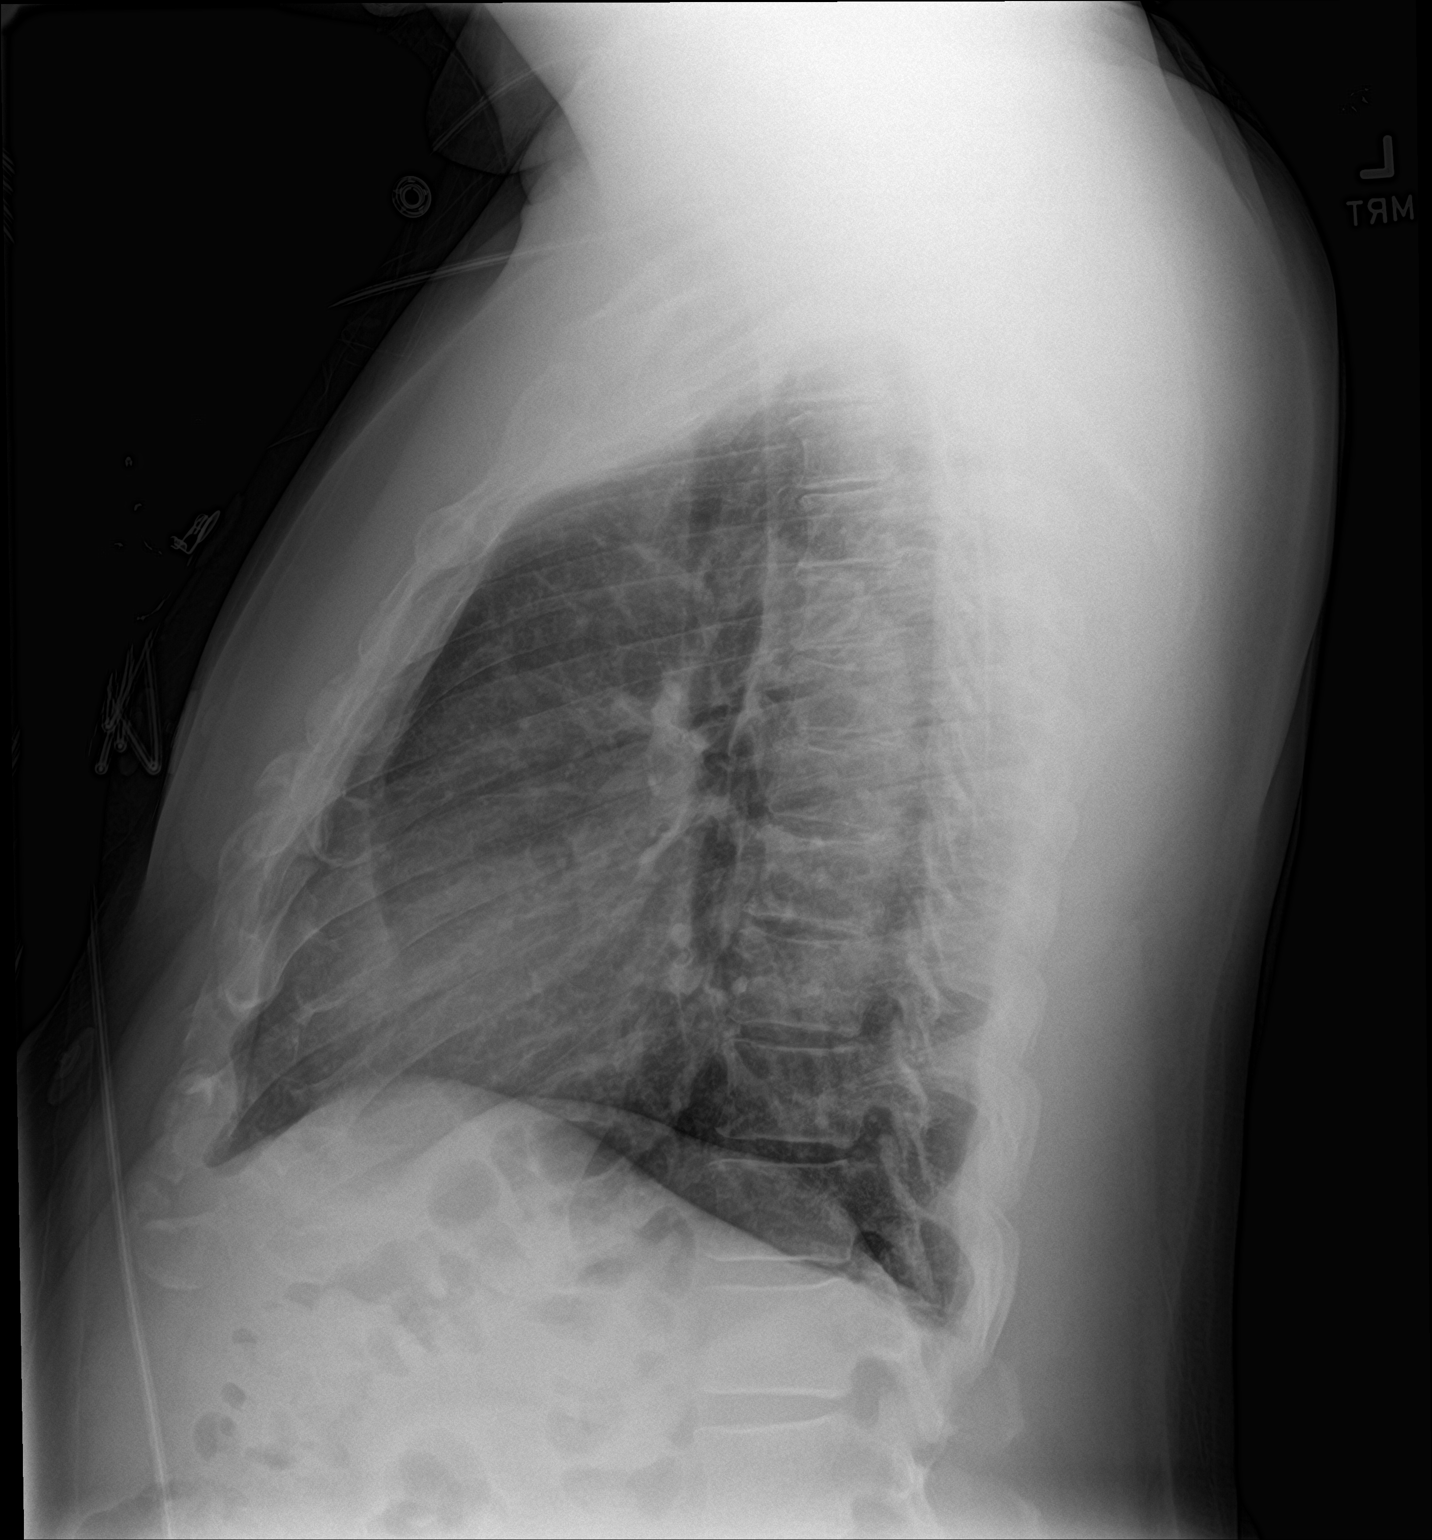

[2 of 2 positions shown; findings below may reference images not displayed]

FINDINGS: Lungs are clear. Heart size and pulmonary vascularity are normal. No
adenopathy. No pneumothorax. No bone lesions.
IMPRESSION: No edema or consolidation.
